# Patient Record
Sex: Male | Born: 2000 | Hispanic: Yes | Marital: Single
Health system: Southern US, Community
[De-identification: ages and names within clinical notes are randomized; demographics above are authoritative.]

---

## 2019-03-08 ENCOUNTER — Inpatient Hospital Stay (HOSPITAL_COMMUNITY): Payer: Self-pay | Admitting: Certified Registered Nurse Anesthetist

## 2019-03-08 ENCOUNTER — Emergency Department (HOSPITAL_COMMUNITY): Payer: Self-pay

## 2019-03-08 ENCOUNTER — Inpatient Hospital Stay (HOSPITAL_COMMUNITY)
Admission: EM | Admit: 2019-03-08 | Discharge: 2019-03-12 | DRG: 957 | Disposition: A | Discharge status: Home / Self Care | Payer: Self-pay | Attending: Surgery | Admitting: Surgery

## 2019-03-08 ENCOUNTER — Inpatient Hospital Stay (HOSPITAL_COMMUNITY): Payer: Self-pay

## 2019-03-08 ENCOUNTER — Encounter (HOSPITAL_COMMUNITY): Payer: Self-pay | Admitting: Pharmacy Technician

## 2019-03-08 ENCOUNTER — Encounter (HOSPITAL_COMMUNITY): Admission: EM | Disposition: A | Discharge status: Home / Self Care | Payer: Self-pay | Source: Home / Self Care

## 2019-03-08 DIAGNOSIS — S272XXA Traumatic hemopneumothorax, initial encounter: Secondary | ICD-10-CM | POA: Diagnosis present

## 2019-03-08 DIAGNOSIS — J9690 Respiratory failure, unspecified, unspecified whether with hypoxia or hypercapnia: Secondary | ICD-10-CM | POA: Diagnosis present

## 2019-03-08 DIAGNOSIS — S36039S Unspecified laceration of spleen, sequela: Secondary | ICD-10-CM

## 2019-03-08 DIAGNOSIS — S329XXA Fracture of unspecified parts of lumbosacral spine and pelvis, initial encounter for closed fracture: Secondary | ICD-10-CM

## 2019-03-08 DIAGNOSIS — W1789XA Other fall from one level to another, initial encounter: Secondary | ICD-10-CM | POA: Diagnosis present

## 2019-03-08 DIAGNOSIS — S22068A Other fracture of T7-T8 thoracic vertebra, initial encounter for closed fracture: Secondary | ICD-10-CM | POA: Diagnosis present

## 2019-03-08 DIAGNOSIS — S065X9A Traumatic subdural hemorrhage with loss of consciousness of unspecified duration, initial encounter: Secondary | ICD-10-CM | POA: Diagnosis present

## 2019-03-08 DIAGNOSIS — S2232XA Fracture of one rib, left side, initial encounter for closed fracture: Secondary | ICD-10-CM

## 2019-03-08 DIAGNOSIS — S27321A Contusion of lung, unilateral, initial encounter: Secondary | ICD-10-CM | POA: Diagnosis present

## 2019-03-08 DIAGNOSIS — S2242XA Multiple fractures of ribs, left side, initial encounter for closed fracture: Secondary | ICD-10-CM | POA: Diagnosis present

## 2019-03-08 DIAGNOSIS — S36031A Moderate laceration of spleen, initial encounter: Principal | ICD-10-CM | POA: Diagnosis present

## 2019-03-08 DIAGNOSIS — Z20828 Contact with and (suspected) exposure to other viral communicable diseases: Secondary | ICD-10-CM | POA: Diagnosis present

## 2019-03-08 DIAGNOSIS — Y939 Activity, unspecified: Secondary | ICD-10-CM

## 2019-03-08 DIAGNOSIS — S22058A Other fracture of T5-T6 vertebra, initial encounter for closed fracture: Secondary | ICD-10-CM | POA: Diagnosis present

## 2019-03-08 DIAGNOSIS — S36039A Unspecified laceration of spleen, initial encounter: Secondary | ICD-10-CM | POA: Diagnosis present

## 2019-03-08 DIAGNOSIS — S22078A Other fracture of T9-T10 vertebra, initial encounter for closed fracture: Secondary | ICD-10-CM | POA: Diagnosis present

## 2019-03-08 DIAGNOSIS — Y9289 Other specified places as the place of occurrence of the external cause: Secondary | ICD-10-CM

## 2019-03-08 DIAGNOSIS — I609 Nontraumatic subarachnoid hemorrhage, unspecified: Secondary | ICD-10-CM

## 2019-03-08 DIAGNOSIS — Y999 Unspecified external cause status: Secondary | ICD-10-CM

## 2019-03-08 DIAGNOSIS — R269 Unspecified abnormalities of gait and mobility: Secondary | ICD-10-CM | POA: Diagnosis present

## 2019-03-08 DIAGNOSIS — S32038A Other fracture of third lumbar vertebra, initial encounter for closed fracture: Secondary | ICD-10-CM | POA: Diagnosis present

## 2019-03-08 DIAGNOSIS — S32302A Unspecified fracture of left ilium, initial encounter for closed fracture: Secondary | ICD-10-CM | POA: Diagnosis present

## 2019-03-08 HISTORY — PX: IR EMBO ART  VEN HEMORR LYMPH EXTRAV  INC GUIDE ROADMAPPING: IMG5450

## 2019-03-08 HISTORY — PX: RADIOLOGY WITH ANESTHESIA: SHX6223

## 2019-03-08 HISTORY — PX: IR ANGIOGRAM VISCERAL SELECTIVE: IMG657

## 2019-03-08 HISTORY — PX: IR US GUIDE VASC ACCESS RIGHT: IMG2390

## 2019-03-08 HISTORY — PX: IR US GUIDE VASC ACCESS LEFT: IMG2389

## 2019-03-08 HISTORY — PX: IR ANGIOGRAM SELECTIVE EACH ADDITIONAL VESSEL: IMG667

## 2019-03-08 HISTORY — PX: IR FLUORO GUIDE CV LINE LEFT: IMG2282

## 2019-03-08 LAB — CBC
HCT: 44.2 % (ref 39.0–52.0)
HCT: 39.8 % (ref 39.0–52.0)
HCT: 33.6 % — ABNORMAL LOW (ref 39.0–52.0)
Hemoglobin: 15.5 g/dL (ref 13.0–17.0)
Hemoglobin: 13.7 g/dL (ref 13.0–17.0)
Hemoglobin: 11.9 g/dL — ABNORMAL LOW (ref 13.0–17.0)
MCH: 31.8 pg (ref 26.0–34.0)
MCH: 30.2 pg (ref 26.0–34.0)
MCH: 30.4 pg (ref 26.0–34.0)
MCHC: 35.1 g/dL (ref 30.0–36.0)
MCHC: 34.4 g/dL (ref 30.0–36.0)
MCHC: 35.4 g/dL (ref 30.0–36.0)
MCV: 90.8 fL (ref 80.0–100.0)
MCV: 87.7 fL (ref 80.0–100.0)
MCV: 85.9 fL (ref 80.0–100.0)
Platelets: 341 10*3/uL (ref 150–400)
Platelets: 157 10*3/uL (ref 150–400)
Platelets: 143 10*3/uL — ABNORMAL LOW (ref 150–400)
RBC: 4.87 MIL/uL (ref 4.22–5.81)
RBC: 4.54 MIL/uL (ref 4.22–5.81)
RBC: 3.91 MIL/uL — ABNORMAL LOW (ref 4.22–5.81)
RDW: 11.9 % (ref 11.5–15.5)
RDW: 14.3 % (ref 11.5–15.5)
RDW: 14.6 % (ref 11.5–15.5)
WBC: 20.8 10*3/uL — ABNORMAL HIGH (ref 4.0–10.5)
WBC: 12.4 10*3/uL — ABNORMAL HIGH (ref 4.0–10.5)
WBC: 6.9 10*3/uL (ref 4.0–10.5)
nRBC: 0 % (ref 0.0–0.2)
nRBC: 0 % (ref 0.0–0.2)
nRBC: 0 % (ref 0.0–0.2)

## 2019-03-08 LAB — ETHANOL: Alcohol, Ethyl (B): <10 mg/dL

## 2019-03-08 LAB — CDS SEROLOGY

## 2019-03-08 LAB — POCT I-STAT EG7
Acid-Base Excess: 1 mmol/L (ref 0.0–2.0)
Acid-base deficit: 9 mmol/L — ABNORMAL HIGH (ref 0.0–2.0)
Bicarbonate: 28.7 mmol/L — ABNORMAL HIGH (ref 20.0–28.0)
Bicarbonate: 16.4 mmol/L — ABNORMAL LOW (ref 20.0–28.0)
Calcium, Ion: 1.21 mmol/L (ref 1.15–1.40)
Calcium, Ion: 0.81 mmol/L — CL (ref 1.15–1.40)
HCT: 46 % (ref 39.0–52.0)
HCT: 24 % — ABNORMAL LOW (ref 39.0–52.0)
Hemoglobin: 15.6 g/dL (ref 13.0–17.0)
Hemoglobin: 8.2 g/dL — ABNORMAL LOW (ref 13.0–17.0)
O2 Saturation: 56 %
O2 Saturation: 74 %
Potassium: 3.1 mmol/L — ABNORMAL LOW (ref 3.5–5.1)
Potassium: 3.7 mmol/L (ref 3.5–5.1)
Sodium: 142 mmol/L (ref 135–145)
Sodium: 147 mmol/L — ABNORMAL HIGH (ref 135–145)
TCO2: 30 mmol/L (ref 22–32)
TCO2: 17 mmol/L — ABNORMAL LOW (ref 22–32)
pCO2, Ven: 59.2 mmHg (ref 44.0–60.0)
pCO2, Ven: 33.9 mmHg — ABNORMAL LOW (ref 44.0–60.0)
pH, Ven: 7.294 (ref 7.250–7.430)
pH, Ven: 7.291 (ref 7.250–7.430)
pO2, Ven: 33 mmHg (ref 32.0–45.0)
pO2, Ven: 44 mmHg (ref 32.0–45.0)

## 2019-03-08 LAB — URINALYSIS, ROUTINE W REFLEX MICROSCOPIC
Bilirubin Urine: NEGATIVE
Glucose, UA: 50 mg/dL — AB
Ketones, ur: NEGATIVE mg/dL
Leukocytes,Ua: NEGATIVE
Nitrite: NEGATIVE
Protein, ur: 100 mg/dL — AB
Specific Gravity, Urine: 1.043 — ABNORMAL HIGH (ref 1.005–1.030)
pH: 8 (ref 5.0–8.0)

## 2019-03-08 LAB — POC SARS CORONAVIRUS 2 AG -  ED: SARS Coronavirus 2 Ag: NEGATIVE

## 2019-03-08 LAB — I-STAT CHEM 8, ED
BUN: 11 mg/dL (ref 8–23)
Calcium, Ion: 1.21 mmol/L (ref 1.15–1.40)
Chloride: 102 mmol/L (ref 98–111)
Creatinine, Ser: 1 mg/dL (ref 0.61–1.24)
Glucose, Bld: 163 mg/dL — ABNORMAL HIGH (ref 70–99)
HCT: 46 % (ref 39.0–52.0)
Hemoglobin: 15.6 g/dL (ref 13.0–17.0)
Potassium: 3.2 mmol/L — ABNORMAL LOW (ref 3.5–5.1)
Sodium: 142 mmol/L (ref 135–145)
TCO2: 29 mmol/L (ref 22–32)

## 2019-03-08 LAB — POCT I-STAT 7, (LYTES, BLD GAS, ICA,H+H)
Acid-base deficit: 3 mmol/L — ABNORMAL HIGH (ref 0.0–2.0)
Bicarbonate: 21.2 mmol/L (ref 20.0–28.0)
Calcium, Ion: 1.13 mmol/L — ABNORMAL LOW (ref 1.15–1.40)
HCT: 37 % — ABNORMAL LOW (ref 39.0–52.0)
Hemoglobin: 12.6 g/dL — ABNORMAL LOW (ref 13.0–17.0)
O2 Saturation: 100 %
Patient temperature: 98.6
Potassium: 4.4 mmol/L (ref 3.5–5.1)
Sodium: 138 mmol/L (ref 135–145)
TCO2: 22 mmol/L (ref 22–32)
pCO2 arterial: 33 mmHg (ref 32.0–48.0)
pH, Arterial: 7.416 (ref 7.350–7.450)
pO2, Arterial: 165 mmHg — ABNORMAL HIGH (ref 83.0–108.0)

## 2019-03-08 LAB — PROTIME-INR
INR: 1.2 (ref 0.8–1.2)
Prothrombin Time: 15.5 seconds — ABNORMAL HIGH (ref 11.4–15.2)

## 2019-03-08 LAB — GLUCOSE, CAPILLARY: Glucose-Capillary: 90 mg/dL (ref 70–99)

## 2019-03-08 LAB — SAMPLE TO BLOOD BANK

## 2019-03-08 LAB — LACTIC ACID, PLASMA: Lactic Acid, Venous: 3.5 mmol/L (ref 0.5–1.9)

## 2019-03-08 LAB — PREPARE RBC (CROSSMATCH)

## 2019-03-08 LAB — MRSA PCR SCREENING: MRSA by PCR: NEGATIVE

## 2019-03-08 SURGERY — IR WITH ANESTHESIA
Anesthesia: General

## 2019-03-08 MED ORDER — PROPOFOL 500 MG/50ML IV EMUL
INTRAVENOUS | Status: DC | PRN
  Administered 2019-03-08: 50 ug/kg/min via INTRAVENOUS

## 2019-03-08 MED ORDER — PROPOFOL 1000 MG/100ML IV EMUL
0.0000 ug/kg/min | INTRAVENOUS | Status: DC
  Administered 2019-03-08: 50 ug/kg/min via INTRAVENOUS
  Administered 2019-03-08 – 2019-03-09 (×2): 45 ug/kg/min via INTRAVENOUS
  Filled 2019-03-08 (×3): qty 100

## 2019-03-08 MED ORDER — FENTANYL CITRATE (PF) 100 MCG/2ML IJ SOLN
INTRAMUSCULAR | Status: AC | PRN
  Administered 2019-03-08 (×2): 50 ug via INTRAVENOUS

## 2019-03-08 MED ORDER — PANTOPRAZOLE SODIUM 40 MG IV SOLR
40.0000 mg | Freq: Every day | INTRAVENOUS | Status: DC
  Administered 2019-03-09: 40 mg via INTRAVENOUS
  Filled 2019-03-08: qty 40

## 2019-03-08 MED ORDER — SODIUM BICARBONATE 8.4 % IV SOLN
INTRAVENOUS | Status: AC
  Filled 2019-03-08: qty 50

## 2019-03-08 MED ORDER — ROCURONIUM BROMIDE 10 MG/ML (PF) SYRINGE
PREFILLED_SYRINGE | INTRAVENOUS | Status: DC | PRN
  Administered 2019-03-08: 30 mg via INTRAVENOUS
  Administered 2019-03-08: 50 mg via INTRAVENOUS

## 2019-03-08 MED ORDER — ALBUMIN HUMAN 5 % IV SOLN
INTRAVENOUS | Status: DC | PRN
  Administered 2019-03-08 (×2): via INTRAVENOUS

## 2019-03-08 MED ORDER — PANTOPRAZOLE SODIUM 40 MG PO TBEC: 40.0000 mg | DELAYED_RELEASE_TABLET | Freq: Every day | ORAL | Status: DC

## 2019-03-08 MED ORDER — CEFAZOLIN SODIUM-DEXTROSE 2-4 GM/100ML-% IV SOLN
INTRAVENOUS | Status: AC
  Filled 2019-03-08: qty 100

## 2019-03-08 MED ORDER — IOHEXOL 300 MG/ML  SOLN
100.0000 mL | Freq: Once | INTRAMUSCULAR | Status: AC | PRN
  Administered 2019-03-08: 100 mL via INTRAVENOUS

## 2019-03-08 MED ORDER — PHENYLEPHRINE HCL-NACL 10-0.9 MG/250ML-% IV SOLN
INTRAVENOUS | Status: DC | PRN
  Administered 2019-03-08: 25 ug/min via INTRAVENOUS

## 2019-03-08 MED ORDER — FENTANYL 2500MCG IN NS 250ML (10MCG/ML) PREMIX INFUSION
50.0000 ug/h | INTRAVENOUS | Status: DC
  Administered 2019-03-08: 100 ug/h via INTRAVENOUS
  Filled 2019-03-08: qty 250

## 2019-03-08 MED ORDER — SUCCINYLCHOLINE CHLORIDE 20 MG/ML IJ SOLN
INTRAMUSCULAR | Status: AC | PRN
  Administered 2019-03-08: 100 mg via INTRAVENOUS

## 2019-03-08 MED ORDER — FENTANYL CITRATE (PF) 100 MCG/2ML IJ SOLN: 50.0000 ug | Freq: Once | INTRAMUSCULAR | Status: DC

## 2019-03-08 MED ORDER — FENTANYL CITRATE (PF) 100 MCG/2ML IJ SOLN
INTRAMUSCULAR | Status: AC
  Filled 2019-03-08: qty 2

## 2019-03-08 MED ORDER — LIDOCAINE HCL 1 % IJ SOLN
INTRAMUSCULAR | Status: AC
  Filled 2019-03-08: qty 20

## 2019-03-08 MED ORDER — DOCUSATE SODIUM 50 MG/5ML PO LIQD: 100.0000 mg | Freq: Two times a day (BID) | ORAL | Status: DC | PRN

## 2019-03-08 MED ORDER — ONDANSETRON 4 MG PO TBDP: 4.0000 mg | ORAL_TABLET | Freq: Four times a day (QID) | ORAL | Status: DC | PRN

## 2019-03-08 MED ORDER — CEFAZOLIN SODIUM-DEXTROSE 2-3 GM-%(50ML) IV SOLR
INTRAVENOUS | Status: DC | PRN
  Administered 2019-03-08: 2 g via INTRAVENOUS

## 2019-03-08 MED ORDER — ROCURONIUM BROMIDE 50 MG/5ML IV SOLN
INTRAVENOUS | Status: AC | PRN
  Administered 2019-03-08: 100 mg via INTRAVENOUS

## 2019-03-08 MED ORDER — IOHEXOL 300 MG/ML  SOLN
100.0000 mL | Freq: Once | INTRAMUSCULAR | Status: AC | PRN
  Administered 2019-03-08: 40 mL via INTRA_ARTERIAL

## 2019-03-08 MED ORDER — LACTATED RINGERS IV SOLN
INTRAVENOUS | Status: DC | PRN
  Administered 2019-03-08 (×2): via INTRAVENOUS

## 2019-03-08 MED ORDER — ORAL CARE MOUTH RINSE
15.0000 mL | OROMUCOSAL | Status: DC
  Administered 2019-03-08 – 2019-03-09 (×9): 15 mL via OROMUCOSAL

## 2019-03-08 MED ORDER — IOHEXOL 300 MG/ML  SOLN
150.0000 mL | Freq: Once | INTRAMUSCULAR | Status: AC | PRN
  Administered 2019-03-08: 48 mL via INTRA_ARTERIAL

## 2019-03-08 MED ORDER — PHENYLEPHRINE 40 MCG/ML (10ML) SYRINGE FOR IV PUSH (FOR BLOOD PRESSURE SUPPORT)
PREFILLED_SYRINGE | INTRAVENOUS | Status: DC | PRN
  Administered 2019-03-08: 120 ug via INTRAVENOUS
  Administered 2019-03-08 (×2): 80 ug via INTRAVENOUS

## 2019-03-08 MED ORDER — POTASSIUM CHLORIDE IN NACL 20-0.9 MEQ/L-% IV SOLN
INTRAVENOUS | Status: DC
  Administered 2019-03-08 – 2019-03-09 (×2): via INTRAVENOUS
  Filled 2019-03-08 (×2): qty 1000

## 2019-03-08 MED ORDER — SODIUM CHLORIDE 0.9 % IV SOLN
INTRAVENOUS | Status: DC | PRN
  Administered 2019-03-08: 14:00:00 via INTRAVENOUS

## 2019-03-08 MED ORDER — PROPOFOL 1000 MG/100ML IV EMUL
INTRAVENOUS | Status: AC
  Administered 2019-03-08: 18:00:00
  Filled 2019-03-08: qty 100

## 2019-03-08 MED ORDER — SODIUM CHLORIDE 0.9 % IV SOLN
INTRAVENOUS | Status: AC | PRN
  Administered 2019-03-08: 1000 mL via INTRAVENOUS

## 2019-03-08 MED ORDER — SODIUM BICARBONATE 4.2 % IV SOLN
INTRAVENOUS | Status: DC | PRN
  Administered 2019-03-08 (×2): 50 meq via INTRAVENOUS

## 2019-03-08 MED ORDER — CHLORHEXIDINE GLUCONATE CLOTH 2 % EX PADS
6.0000 | MEDICATED_PAD | Freq: Every day | CUTANEOUS | Status: DC
  Administered 2019-03-08 – 2019-03-12 (×3): 6 via TOPICAL

## 2019-03-08 MED ORDER — ONDANSETRON HCL 4 MG/2ML IJ SOLN: 4.0000 mg | Freq: Four times a day (QID) | INTRAMUSCULAR | Status: DC | PRN

## 2019-03-08 MED ORDER — SODIUM CHLORIDE 0.9% IV SOLUTION: Freq: Once | INTRAVENOUS | Status: DC

## 2019-03-08 MED ORDER — CHLORHEXIDINE GLUCONATE 0.12% ORAL RINSE (MEDLINE KIT)
15.0000 mL | Freq: Two times a day (BID) | OROMUCOSAL | Status: DC
  Administered 2019-03-08 – 2019-03-09 (×3): 15 mL via OROMUCOSAL

## 2019-03-08 MED ORDER — ETOMIDATE 2 MG/ML IV SOLN
INTRAVENOUS | Status: AC | PRN
  Administered 2019-03-08: 20 mg via INTRAVENOUS

## 2019-03-08 MED ORDER — FENTANYL BOLUS VIA INFUSION
50.0000 ug | INTRAVENOUS | Status: DC | PRN
  Filled 2019-03-08: qty 50

## 2019-03-08 NOTE — ED Triage Notes (Signed)
Pt bib ems after approx 20 foot unwitnessed fall. Bystanders found pt unresponsive. Ems reports pt moaning and combative with periods of apnea. No obvious trauma noted.

## 2019-03-08 NOTE — ED Notes (Signed)
Dr Arnoldo Morale with Neurosurgery at the bedside

## 2019-03-08 NOTE — Anesthesia Procedure Notes (Signed)
Date/Time: 03/08/2019 2:15 PM Performed by: Colin Benton, CRNA Pre-anesthesia Checklist: Patient identified, Emergency Drugs available, Suction available and Patient being monitored Patient Re-evaluated:Patient Re-evaluated prior to induction Oxygen Delivery Method: Circle system utilized Induction Type: Inhalational induction with existing ETT Placement Confirmation: positive ETCO2 and breath sounds checked- equal and bilateral Dental Injury: Teeth and Oropharynx as per pre-operative assessment

## 2019-03-08 NOTE — ED Notes (Signed)
Father contacted. Spanish speaking only. [phone].

## 2019-03-08 NOTE — Progress Notes (Signed)
Pt transported to IR with ED RN, RT and IR RN on vent. Pt switched over to CRNA's vent upon arrival and RT placed pt's servo vent in room 4N 26. No apparent complications during transport.

## 2019-03-08 NOTE — Procedures (Signed)
FAST  Pre-procedure diagnosis:fall Post-procedure diagnosis:no pericardial effusion, equivocal pelvic view Procedure: FAST Surgeon: Georganna Skeans, MD Procedure in detail: The patient's abdomen was imaged in 4 regions with the ultrasound. First, the right upper quadrant was imaged. No free fluid was seen between the right kidney and the liver in Morison's pouch. Next, the epigastrium was imaged. No significant pericardial effusion was seen. Next, the left upper quadrant was imaged. No free fluid was seen between the left kidney and the spleen. Finally, the bladder was imaged. ? free fluid was seen next to the bladder in the pelvis. Impression: ? Free fluid next to bladder  Georganna Skeans, MD, MPH, FACS Trauma: [phone] General Surgery: [phone]

## 2019-03-08 NOTE — Anesthesia Preprocedure Evaluation (Addendum)
Anesthesia Evaluation   Patient unresponsive    Reviewed: Unable to perform ROS - Chart review onlyPreop documentation limited or incomplete due to emergent nature of procedure.  Airway Mallampati: Intubated       Dental   Pulmonary neg recent URI,    breath sounds clear to auscultation       Cardiovascular  Rate:Tachycardia     Neuro/Psych    GI/Hepatic   Endo/Other    Renal/GU      Musculoskeletal   Abdominal   Peds  Hematology  (+) Blood dyscrasia, anemia , Acute blood loss anemia   Anesthesia Other Findings Left iliac wing fx splenic lac TBI with subdural  multiple rib fxs with pulm contusions multiple TVP fxs   Reproductive/Obstetrics                             Anesthesia Physical Anesthesia Plan  ASA: III and emergent  Anesthesia Plan: General   Post-op Pain Management:    Induction: Inhalational  PONV Risk Score and Plan: 2 and Treatment may vary due to age or medical condition  Airway Management Planned: Oral ETT  Additional Equipment: Arterial line  Intra-op Plan:   Post-operative Plan: Post-operative intubation/ventilation  Informed Consent:     History available from chart only and Only emergency history available  Plan Discussed with: CRNA  Anesthesia Plan Comments:         Anesthesia Quick Evaluation

## 2019-03-08 NOTE — Procedures (Signed)
Interventional Radiology Procedure:   Indications: Trauma and splenic laceration.    Procedure: Visceral angiogram and splenic artery embolization  Placement of left femoral central line  Findings: Variant anatomy.  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  Main splenic artery was embolized with 6 mm coils.  Minor residual flow to spleen through collaterals after embolization.  No active bleeding from spleen.  Exoseal used for right groin hemostasis.   Dual lumen central line placed in left CFV.   Complications: None     EBL: less than 20 ml  Plan: Keep right leg straight for 3 hours.    Allen Allen R. Anselm Pancoast, MD  Pager: [phone]

## 2019-03-08 NOTE — Progress Notes (Signed)
Received patient from the ED, Gwenith Daily RN.  Patient transported from ED to IR with this RN, Visual merchandiser and respiratory.  Patient on a vent.  Anesthesia in IR suite upon arrival.

## 2019-03-08 NOTE — Transfer of Care (Signed)
Immediate Anesthesia Transfer of Care Note  Patient: Allen Allen  Procedure(s) Performed: IR FOR EMBOLIZATION (N/A )  Patient Location: ICU  Anesthesia Type:General  Level of Consciousness: sedated and Patient remains intubated per anesthesia plan  Airway & Oxygen Therapy: Patient remains intubated per anesthesia plan and Patient placed on Ventilator (see vital sign flow sheet for setting)  Post-op Assessment: Report given to RN and Post -op Vital signs reviewed and stable  Post vital signs: Reviewed and stable  Last Vitals:  Vitals Value Taken Time  BP    Temp 36.1 C 03/08/19 1634  Pulse 75 03/08/19 1636  Resp 21 03/08/19 1636  SpO2 100 % 03/08/19 1636  Vitals shown include unvalidated device data.  Last Pain:  Vitals:   03/08/19 1634  TempSrc: Axillary         Complications: No apparent anesthesia complications

## 2019-03-08 NOTE — H&P (Signed)
Allen Allen 04/19/1877  [id number].    Requesting MD: Dr. Sedonia Small Chief Complaint: Fall  Reason for Consult: Level 1  Primary Survey: airway intact, breath sounds intact bilaterally, pulses intact peripherally  GCS: E3V4M5  HPI: Allen Allen is a 18 y.o. male with no reported past medical hx who was brought in as a level 1 to [facility] after he was found down after a suspected fall. Per EMS, patient was at a construction site at the [place]. He was suspected to have a fall from 25+ft through a skylight. + LOC. This was unwitnessed. EMS reports no blood or broken glass at the scene. He awoke on route and was combative. Upon arrival to trauma bay, patient was able to state name and state her hurt all over. He was combative. Had tenderness to chest wall. No obvious deformities to extremities. He was intubated in trauma bay. CXR and Pelvis xray unremarkable. He was taken to CT scanner for further eval.   ROS: Review of Systems  Unable to perform ROS: Intubated    No family history on file.  No past medical history on file.  The histories are not reviewed yet. Please review them in the "History" navigator section and refresh this [place].  Social History:  has no history on file for tobacco, alcohol, and drug.  Allergies: Not on File  (Not in a hospital admission)    Physical Exam: Blood pressure 134/90, pulse (!) 102, temperature (!) 97 F (36.1 C), temperature source Temporal, resp. rate 19, height 5\' 10"  (1.778 m), weight 74.8 kg, SpO2 100 %. Physical Exam  Constitutional: No distress. He is intubated.  HENT:  Head: Normocephalic and atraumatic. Head is without raccoon's eyes, without Battle's sign, without abrasion and without contusion.  Right Ear: External ear normal.  Left Ear: External ear normal.  Nose: Nose normal.  Mouth/Throat: Uvula is midline, oropharynx is clear and moist and mucous membranes are normal. Normal dentition.  OG tube in place  Eyes: Pupils are  equal, round, and reactive to light. Conjunctivae are normal.  51mm and sluggish  Neck:  In c-collar  Cardiovascular: Regular rhythm and normal heart sounds. Tachycardia present.  No murmur heard. Pulses:      Radial pulses are 2+ on the right side and 2+ on the left side.       Dorsalis pedis pulses are 2+ on the right side and 2+ on the left side.       Posterior tibial pulses are 2+ on the right side and 2+ on the left side.  Pulmonary/Chest: Effort normal and breath sounds normal. He is intubated. He exhibits tenderness (b/l). He exhibits no crepitus and no deformity.  Abdominal: Soft. Normal appearance and bowel sounds are normal. There is no abdominal tenderness. There is no rigidity and no guarding.  Genitourinary:    Genitourinary Comments: Normal rectal tone without obvious blood   Musculoskeletal:     Comments: Passive rom of all extremities without obvious deformity. No step offs of t or l spine.   Neurological:  Sedated on vent  Skin: Skin is warm, dry and intact. No abrasion noted.  Nursing note and vitals reviewed.    No results found for this or any previous visit (from the past 48 hour(s)). No results found.  Anti-infectives (From admission, onward)   None       Assessment/Plan Fall from 25+ft G3 splenic lac with parasplenic hematoma and active extrav - To IR Left iliac wing fracture  with surrounding hematoma - Ortho consulted Left SDH - Consult to NS Left Pulm Contusion/small PTX/HPTX - No CT warranted at this time.  Left Rib Fx 6, 7, 8 - Multimodal pain control  T7, T8, T9 and L3 TP fxs - Multimodal pain control  VDRF  Foley - To be placed  FEN - NPO, OGT, IVF VTE - SCDs ID - None  Plan: 1U PRBC and 1U FFP. To IR for splenic lac with active extrav. ICU after.   Jacinto Halim, [facility] Surgery 03/08/2019, 11:41 AM Please see Amion for pager number during day hours 7:00am-4:30pm

## 2019-03-08 NOTE — ED Notes (Signed)
Aspen collar applied

## 2019-03-08 NOTE — Consult Note (Signed)
Chief Complaint: Patient was seen in consultation today for mesenteric arteriogram with possible splenic embolization at the request of Dr Elwyn LadeB Thompson   Supervising Physician: Richarda OverlieHenn, Adam  [facility]  History of Present Illness: Allen Allen is a [age] y.o. male   Unwitnessed fall-- brought to ED by EMS Multiple injuries Intubated  Trauma note 11/19: Fall from 25+ft G3 splenic lac with parasplenic hematoma and active extrav - To IR Left iliac wing fracture with surrounding hematoma - Ortho consulted Left SDH - Consult to NS Left Pulm Contusion/small PTX/HPTX - No CT warranted at this time.  Left Rib Fx 6, 7, 8 - Multimodal pain control  T7, T8, T9 and L3 TP fxs   Request for mesenteric arteriogram with possible splenic embolization per Dr Elwyn LadeB Thompson    Allergies: Patient has no known allergies.  Medications: Prior to Admission medications   Not on File     No family history on file.  Social History   Socioeconomic History   Marital status: Not on file    Spouse name: Not on file   Number of children: Not on file   Years of education: Not on file   Highest education level: Not on file  Occupational History   Not on file  Social Needs   Financial resource strain: Not on file   Food insecurity    Worry: Not on file    Inability: Not on file   Transportation needs    Medical: Not on file    Non-medical: Not on file  Tobacco Use   Smoking status: Not on file  Substance and Sexual Activity   Alcohol use: Not on file   Drug use: Not on file   Sexual activity: Not on file  Lifestyle   Physical activity    Days per week: Not on file    Minutes per session: Not on file   Stress: Not on file  Relationships   Social connections    Talks on phone: Not on file    Gets together: Not on file    Attends religious service: Not on file    Active member of club or organization: Not on file    Attends meetings of clubs or  organizations: Not on file    Relationship status: Not on file  Other Topics Concern   Not on file  Social History Narrative   Not on file    Review of Systems: A 12 point ROS discussed and pertinent positives are indicated in the HPI above.  All other systems are negative.   Vital Signs: BP 127/83    Pulse (!) 115    Temp (!) 97 F (36.1 C) (Temporal)    Resp 16    Ht 5\' 10"  (1.778 m)    Wt 165 lb (74.8 kg)    SpO2 100%    BMI 23.68 kg/m   Physical Exam Vitals signs reviewed.  Constitutional:      Comments: intubated  Musculoskeletal:     Comments: No movement  Skin:    General: Skin is warm.  Neurological:     Comments: No response     Imaging: Ct Head Wo Contrast  Result Date: 03/08/2019 CLINICAL DATA:  Fall.  Found down. EXAM: CT HEAD WITHOUT CONTRAST CT CERVICAL SPINE WITHOUT CONTRAST TECHNIQUE: Multidetector CT imaging of the head and cervical spine was performed following the standard protocol without intravenous contrast. Multiplanar CT image reconstructions of the cervical spine were also  generated. COMPARISON:  None. FINDINGS: CT HEAD FINDINGS Brain: Small left cerebral convexity acute subdural hematoma measuring up to 4-5 mm in maximal thickness. Minimal mass effect on the adjacent sulci with 3 mm left-to-right midline shift. No herniation. No evidence of acute infarction, hydrocephalus, or mass lesion. Vascular: No hyperdense vessel or unexpected calcification. Skull: Normal. Negative for fracture or focal lesion. Sinuses/Orbits: No acute finding. Other: None. CT CERVICAL SPINE FINDINGS Alignment: Straightening of the normal cervical lordosis. No traumatic malalignment. Skull base and vertebrae: No acute fracture. No primary bone lesion or focal pathologic process. Soft tissues and spinal canal: No prevertebral fluid or swelling. No visible canal hematoma. Disc levels:  Normal. Upper chest: Please see separate CT chest report from same day. Other: None. IMPRESSION: 1.  Small left cerebral convexity acute subdural hematoma measuring up to 4-5 mm in maximal thickness. 3 mm left-to-right midline shift. 2.  No acute cervical spine fracture. Critical Value/emergent results were discussed in person at the time of interpretation on 03/08/2019 at 12:10 pm with provider Violeta Gelinas, who verbally acknowledged these results. Electronically Signed   By: Obie Dredge M.D.   On: 03/08/2019 12:23   Ct Cervical Spine Wo Contrast  Result Date: 03/08/2019 CLINICAL DATA:  Fall.  Found down. EXAM: CT HEAD WITHOUT CONTRAST CT CERVICAL SPINE WITHOUT CONTRAST TECHNIQUE: Multidetector CT imaging of the head and cervical spine was performed following the standard protocol without intravenous contrast. Multiplanar CT image reconstructions of the cervical spine were also generated. COMPARISON:  None. FINDINGS: CT HEAD FINDINGS Brain: Small left cerebral convexity acute subdural hematoma measuring up to 4-5 mm in maximal thickness. Minimal mass effect on the adjacent sulci with 3 mm left-to-right midline shift. No herniation. No evidence of acute infarction, hydrocephalus, or mass lesion. Vascular: No hyperdense vessel or unexpected calcification. Skull: Normal. Negative for fracture or focal lesion. Sinuses/Orbits: No acute finding. Other: None. CT CERVICAL SPINE FINDINGS Alignment: Straightening of the normal cervical lordosis. No traumatic malalignment. Skull base and vertebrae: No acute fracture. No primary bone lesion or focal pathologic process. Soft tissues and spinal canal: No prevertebral fluid or swelling. No visible canal hematoma. Disc levels:  Normal. Upper chest: Please see separate CT chest report from same day. Other: None. IMPRESSION: 1. Small left cerebral convexity acute subdural hematoma measuring up to 4-5 mm in maximal thickness. 3 mm left-to-right midline shift. 2.  No acute cervical spine fracture. Critical Value/emergent results were discussed in person at the time of  interpretation on 03/08/2019 at 12:10 pm with provider Violeta Gelinas, who verbally acknowledged these results. Electronically Signed   By: Obie Dredge M.D.   On: 03/08/2019 12:23   Dg Pelvis Portable  Result Date: 03/08/2019 CLINICAL DATA:  Level 1 trauma EXAM: PORTABLE PELVIS 1-2 VIEWS COMPARISON:  None. FINDINGS: There is no evidence of pelvic fracture or diastasis. No pelvic bone lesions are seen. IMPRESSION: Negative. Electronically Signed   By: Marnee Spring M.D.   On: 03/08/2019 11:47   Dg Chest Port 1 View  Result Date: 03/08/2019 CLINICAL DATA:  Male patient with trauma. EXAM: PORTABLE CHEST 1 VIEW COMPARISON:  None. FINDINGS: Endotracheal tube with tip approximately 12 mm above the carina. Recommend retraction by 2-3 cm for optimal positioning. Enteric tube with side-port in the distal esophagus and tip in the proximal stomach. Recommend advancing the tube by additional 10 cm. Minimal interstitial densities in the left lung may represent mild contusion. An atypical infection is not excluded. The right lung is clear. There  is no pleural effusion or pneumothorax. The cardiac silhouette is within normal limits. No acute osseous pathology. IMPRESSION: 1. Endotracheal tube with tip above the carina. Recommend retraction by 2-3 cm for optimal positioning. 2. Enteric tube with side-port in the distal esophagus and tip in the proximal stomach. Recommend further advancing by additional 10 cm. 3. Minimal left lung densities may represent contusion. Electronically Signed   By: Anner Crete M.D.   On: 03/08/2019 11:49    Labs:  CBC: Recent Labs    03/08/19 1135 03/08/19 1137 03/08/19 1222 03/08/19 1237  WBC 20.8*  --   --   --   HGB 15.5 15.6 15.6 12.6*  HCT 44.2 46.0 46.0 37.0*  PLT 341  --   --   --     COAGS: Recent Labs    03/08/19 1135  INR 1.2    BMP: Recent Labs    03/08/19 1135 03/08/19 1137 03/08/19 1222 03/08/19 1237  NA 140 142 142 138  K 3.2* 3.2* 3.1*  4.4  CL 104 102  --   --   CO2 25  --   --   --   GLUCOSE 173* 163*  --   --   BUN 8 11  --   --   CALCIUM 9.1  --   --   --   CREATININE 1.02 1.00  --   --   GFRNONAA 44*  --   --   --   GFRAA 51*  --   --   --     LIVER FUNCTION TESTS: Recent Labs    03/08/19 1135  BILITOT 0.9  AST 190*  ALT 115*  ALKPHOS 147*  PROT 7.0  ALBUMIN 4.3    TUMOR MARKERS: No results for input(s): AFPTM, CEA, CA199, CHROMGRNA in the last 8760 hours.  Assessment and Plan:  Grade 3 trauma Splenic hematoma and active extravasation Scheduled for emergent mesenteric arteriogram with possible embolization Dr Grandville Silos signed emergent consent Consent in IR  Thank you for this interesting consult.  I greatly enjoyed meeting Allen Allen and look forward to participating in their care.  A copy of this report was sent to the requesting provider on this date.  Electronically Signed: Lavonia Drafts, PA-C 03/08/2019, 12:50 PM   I spent a total of 20 Minutes    in face to face in clinical consultation, greater than 50% of which was counseling/coordinating care for mesenteric arteriogram with embolization
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
## 2019-03-08 NOTE — ED Provider Notes (Signed)
[facility] Emergency Department Provider Note MRN:  [id number]  Arrival date & time: 03/08/19     Chief Complaint   Altered mental status History of Present Illness   Allen Allen is a 18 y.o. year-old male with unknown past medical history presenting to the ED with chief complaint of altered mental status.  Report of head trauma after fall from 25 feet.  Report of dilated pupils and agitation.  I was unable to obtain an accurate HPI, PMH, or ROS due to the patient's altered mental status.  Level 5 caveat.  Review of Systems  Positive for head trauma, altered mental status  Patient's Health History   History reviewed. No pertinent past medical history.    History reviewed. No pertinent family history.  Social History   Socioeconomic History   Marital status: Not on file    Spouse name: Not on file   Number of children: Not on file   Years of education: Not on file   Highest education level: Not on file  Occupational History   Not on file  Social Needs   Financial resource strain: Not on file   Food insecurity    Worry: Not on file    Inability: Not on file   Transportation needs    Medical: Not on file    Non-medical: Not on file  Tobacco Use   Smoking status: Not on file  Substance and Sexual Activity   Alcohol use: Not on file   Drug use: Not on file   Sexual activity: Not on file  Lifestyle   Physical activity    Days per week: Not on file    Minutes per session: Not on file   Stress: Not on file  Relationships   Social connections    Talks on phone: Not on file    Gets together: Not on file    Attends religious service: Not on file    Active member of club or organization: Not on file    Attends meetings of clubs or organizations: Not on file    Relationship status: Not on file   Intimate partner violence    Fear of current or ex partner: Not on file    Emotionally abused: Not on file    Physically abused:  Not on file    Forced sexual activity: Not on file  Other Topics Concern   Not on file  Social History Narrative   Not on file     Physical Exam  Vital Signs and Nursing Notes reviewed Vitals:   03/08/19 1350 03/08/19 1355  BP: (!) 106/58 (!) 97/57  Pulse: 96 99  Resp: 16 16  Temp: 98.7 F (37.1 C) 98.8 F (37.1 C)  SpO2: 100% 99%    CONSTITUTIONAL: Agitated and combative NEURO: Confused, not following commands, moving all extremities EYES:  eyes equal and reactive ENT/NECK:  no LAD, no JVD CARDIO:  tachycardic rate, well-perfused, normal S1 and S2 PULM:  CTAB no wheezing or rhonchi GI/GU:  normal bowel sounds, non-distended, non-tender MSK/SPINE:  No gross deformities, no edema SKIN:  no rash, atraumatic PSYCH: Unable to assess  Diagnostic and Interventional Summary    EKG Interpretation  Date/Time:  Thursday March 08 2019 12:17:52 EST Ventricular Rate:  107 PR Interval:    QRS Duration: 75 QT Interval:  305 QTC Calculation: 407 R Axis:   79 Text Interpretation: Sinus tachycardia Borderline T abnormalities, diffuse leads Confirmed by Kennis Carina [phone]) on 03/08/2019 12:24:59 PM  Labs Reviewed  COMPREHENSIVE METABOLIC PANEL - Abnormal; Notable for the following components:      Result Value   Potassium 3.2 (*)    Glucose, Bld 173 (*)    AST 190 (*)    ALT 115 (*)    Alkaline Phosphatase 147 (*)    GFR calc non Af Amer 44 (*)    GFR calc Af Amer 51 (*)    All other components within normal limits  CBC - Abnormal; Notable for the following components:   WBC 20.8 (*)    All other components within normal limits  URINALYSIS, ROUTINE W REFLEX MICROSCOPIC - Abnormal; Notable for the following components:   APPearance HAZY (*)    Specific Gravity, Urine 1.043 (*)    Glucose, UA 50 (*)    Hgb urine dipstick SMALL (*)    Protein, ur 100 (*)    Bacteria, UA RARE (*)    All other components within normal limits  LACTIC ACID, PLASMA - Abnormal;  Notable for the following components:   Lactic Acid, Venous 3.5 (*)    All other components within normal limits  PROTIME-INR - Abnormal; Notable for the following components:   Prothrombin Time 15.5 (*)    All other components within normal limits  I-STAT CHEM 8, ED - Abnormal; Notable for the following components:   Potassium 3.2 (*)    Glucose, Bld 163 (*)    All other components within normal limits  POCT I-STAT EG7 - Abnormal; Notable for the following components:   Bicarbonate 28.7 (*)    Potassium 3.1 (*)    All other components within normal limits  POCT I-STAT 7, (LYTES, BLD GAS, ICA,H+H) - Abnormal; Notable for the following components:   pO2, Arterial 165.0 (*)    Acid-base deficit 3.0 (*)    Calcium, Ion 1.13 (*)    HCT 37.0 (*)    Hemoglobin 12.6 (*)    All other components within normal limits  ETHANOL  CDS SEROLOGY  POC SARS CORONAVIRUS 2 AG -  ED  SAMPLE TO BLOOD BANK  PREPARE RBC (CROSSMATCH)  PREPARE FRESH FROZEN PLASMA  TYPE AND SCREEN  ABO/RH    CT Head Wo Contrast  Final Result    CT Cervical Spine Wo Contrast  Final Result    CT Chest W Contrast  Final Result    CT ABDOMEN PELVIS W CONTRAST  Final Result    DG Pelvis Portable  Final Result    DG Chest Port 1 View  Final Result    IR Transcath/Emboliz    (Results Pending)  CT HEAD WO CONTRAST    (Results Pending)    Medications  propofol (DIPRIVAN) 1000 MG/100ML infusion (has no administration in time range)  fentaNYL (SUBLIMAZE) 100 MCG/2ML injection (has no administration in time range)  0.9 %  sodium chloride infusion (Manually program via Guardrails IV Fluids) (has no administration in time range)  0.9 %  sodium chloride infusion (Manually program via Guardrails IV Fluids) (has no administration in time range)  lidocaine (XYLOCAINE) 1 % (with pres) injection (has no administration in time range)  iohexol (OMNIPAQUE) 300 MG/ML solution 150 mL (has no administration in time range)    iohexol (OMNIPAQUE) 300 MG/ML solution 100 mL (has no administration in time range)  ceFAZolin (ANCEF) 2-4 GM/100ML-% IVPB (has no administration in time range)  sodium bicarbonate 1 mEq/mL injection (has no administration in time range)  sodium bicarbonate 1 mEq/mL injection (has no administration in time range)  etomidate (AMIDATE) injection (20 mg Intravenous Given 03/08/19 1125)  succinylcholine (ANECTINE) injection (100 mg Intravenous Given 03/08/19 1126)  0.9 %  sodium chloride infusion (1,000 mLs Intravenous New Bag/Given 03/08/19 1127)  iohexol (OMNIPAQUE) 300 MG/ML solution 100 mL (100 mLs Intravenous Contrast Given 03/08/19 1150)  rocuronium (ZEMURON) injection (100 mg Intravenous Given 03/08/19 1137)  fentaNYL (SUBLIMAZE) injection (50 mcg Intravenous Given 03/08/19 1201)     Procedures  /  Critical Care .Critical Care Performed by: Maudie Flakes, MD Authorized by: Maudie Flakes, MD   Critical care provider statement:    Critical care time (minutes):  35   Critical care was necessary to treat or prevent imminent or life-threatening deterioration of the following conditions:  Trauma   Critical care was time spent personally by me on the following activities:  Discussions with consultants, evaluation of patient's response to treatment, examination of patient, ordering and performing treatments and interventions, ordering and review of laboratory studies, ordering and review of radiographic studies, pulse oximetry, re-evaluation of patient's condition, obtaining history from patient or surrogate and review of old charts Procedure Name: Intubation Date/Time: 03/08/2019 11:48 AM Performed by: Maudie Flakes, MD Pre-anesthesia Checklist: Patient identified, Patient being monitored, Emergency Drugs available, Timeout performed and Suction available Oxygen Delivery Method: Non-rebreather mask Preoxygenation: Pre-oxygenation with 100% oxygen Induction Type: Rapid  sequence Ventilation: Mask ventilation without difficulty Laryngoscope Size: Glidescope and 4 Grade View: Grade II Tube size: 7.5 mm Number of attempts: 1 Airway Equipment and Method: Rigid stylet Placement Confirmation: ETT inserted through vocal cords under direct vision,  CO2 detector and Breath sounds checked- equal and bilateral Secured at: 22 cm Comments: Uncomplicated RSI with 20 mg etomidate and 100 mg succinylcholine       ED Course and Medical Decision Making  I have reviewed the triage vital signs and the nursing notes.  Pertinent labs & imaging results that were available during my care of the patient were reviewed by me and considered in my medical decision making (see below for details).     Concern for possible intracranial bleeding given the mechanism, head trauma, altered mental status.  Primary survey largely reassuring, talking, protecting airway, bilateral breath sounds, strong peripheral pulses.  Intubated as described above for airway protection in the setting of combativeness and needing CT imaging to exclude emergent process.  Hemodynamically stable, awaiting CT results.  Imaging reveals small hemopneumothorax, subarachnoid hemorrhage with midline shift, grade 4 splenic lack, to be brought emergently to IR suite for embolization.  Admitted to trauma service.  Barth Kirks. Sedonia Small, [place] [email or web address]  Final Clinical Impressions(s) / ED Diagnoses     ICD-10-CM   1. SAH (subarachnoid hemorrhage) (HCC)  I60.9   2. Spleen laceration  S36.039A IR Transcath/Emboliz    IR Transcath/Emboliz  3. Laceration of spleen, initial encounter  S36.039A IR Transcath/Emboliz    ED Discharge Orders    None       Discharge Instructions Discussed with and Provided to Patient:   Discharge Instructions   None       Maudie Flakes, MD 03/08/19 1542

## 2019-03-08 NOTE — Anesthesia Postprocedure Evaluation (Signed)
Anesthesia Post Note  Patient: Allen Allen  Procedure(s) Performed: IR FOR EMBOLIZATION (N/A )     Patient location during evaluation: SICU Anesthesia Type: General Level of consciousness: sedated Pain management: pain level controlled Vital Signs Assessment: post-procedure vital signs reviewed and stable Respiratory status: patient remains intubated per anesthesia plan Cardiovascular status: stable Postop Assessment: no apparent nausea or vomiting Anesthetic complications: no    Last Vitals:  Vitals:   03/08/19 1637 03/08/19 1638  BP:  (!) 141/114  Pulse: 77 74  Resp: (!) 27 (!) 21  Temp:  (!) 36.1 C  SpO2: 100% 100%    Last Pain:  Vitals:   03/08/19 1638  TempSrc: Axillary                 Magie Ciampa P Dallin Mccorkel

## 2019-03-08 NOTE — Consult Note (Signed)
Reason for Consult: Acute left subdural hematoma Referring Physician: Dr. Janee Morn  Allen Allen is an 18 y.o. male.  HPI: The patient is a young Hispanic male who by report was found unconscious after a fall.  He was brought to [facility], ER and intubated.  He was worked up further with a head CT which demonstrated a small left acute subdural hematoma and a CT of the abdomen pelvis which demonstrated a splenic injury.  The plan is for IR embolization of a splenic injury.  A neurosurgical consultation was requested by the trauma service.  Presently the patient is sedated and intubated there are no family members available.  A history cannot be obtained.  No past medical history on file.    No family history on file.  Social History:  has no history on file for tobacco, alcohol, and drug.  Allergies: No Known Allergies  Medications:  I have reviewed the patient's current medications. Prior to Admission: (Not in a hospital admission)  Scheduled: . sodium chloride   Intravenous Once  . sodium chloride   Intravenous Once  . fentaNYL       Continuous: . propofol     PRN: Anti-infectives (From admission, onward)   None       Results for orders placed or performed during the hospital encounter of 03/08/19 (from the past 48 hour(s))  Sample to Blood Bank     Status: None   Collection Time: 03/08/19 11:24 AM  Result Value Ref Range   Blood Bank Specimen SAMPLE AVAILABLE FOR TESTING    Sample Expiration      03/11/2019,2359 Performed at [facility], [address] [place], [place] [address]   Lactic acid, plasma     Status: Abnormal   Collection Time: 03/08/19 11:30 AM  Result Value Ref Range   Lactic Acid, Venous 3.5 (HH) 0.5 - 1.9 mmol/L    Comment: CRITICAL RESULT CALLED TO, READ BACK BY AND VERIFIED WITH: K.COBB,RN [id number] 03/08/2019 CLARK,S Performed at [facility], [address], [place], [place] [address]   Type and screen Ordered by PROVIDER  DEFAULT     Status: None (Preliminary result)   Collection Time: 03/08/19 11:32 AM  Result Value Ref Range   ABO/RH(D) O POS    Antibody Screen NEG    Sample Expiration 03/11/2019,2359    Unit Number [id number]    Blood Component Type RED CELLS,LR    Unit division 00    Status of Unit ISSUED    Transfusion Status OK TO TRANSFUSE    Crossmatch Result COMPATIBLE   ABO/Rh     Status: None (Preliminary result)   Collection Time: 03/08/19 11:32 AM  Result Value Ref Range   ABO/RH(D)      O POS Performed at [facility], [address], [place], [place] [address]   Ethanol     Status: None   Collection Time: 03/08/19 11:33 AM  Result Value Ref Range   Alcohol, Ethyl (B) <10 <10 mg/dL    Comment: (NOTE) Lowest detectable limit for serum alcohol is 10 mg/dL. For medical purposes only. Performed at [facility], [address] [place], [place] [address]   Comprehensive metabolic panel     Status: Abnormal   Collection Time: 03/08/19 11:35 AM  Result Value Ref Range   Sodium 140 135 - 145 mmol/L   Potassium 3.2 (L) 3.5 - 5.1 mmol/L   Chloride 104 98 - 111 mmol/L  CO2 25 22 - 32 mmol/L   Glucose, Bld 173 (H) 70 - 99 mg/dL   BUN 8 8 - 23 mg/dL   Creatinine, Ser 8.651.02 0.61 - 1.24 mg/dL   Calcium 9.1 8.9 - 78.410.3 mg/dL   Total Protein 7.0 6.5 - 8.1 g/dL   Albumin 4.3 3.5 - 5.0 g/dL   AST 696190 (H) 15 - 41 U/L   ALT 115 (H) 0 - 44 U/L   Alkaline Phosphatase 147 (H) 38 - 126 U/L   Total Bilirubin 0.9 0.3 - 1.2 mg/dL   GFR calc non Af Amer 44 (L) >60 mL/min   GFR calc Af Amer 51 (L) >60 mL/min   Anion gap 11 5 - 15    Comment: Performed at [facility], [address] [place], [place] [address]  CBC     Status: Abnormal   Collection Time: 03/08/19 11:35 AM  Result Value Ref Range   WBC 20.8 (H) 4.0 - 10.5 K/uL   RBC 4.87 4.22 - 5.81 MIL/uL   Hemoglobin 15.5 13.0 - 17.0 g/dL   HCT 41.344.2 24.439.0 - 01.052.0 %   MCV 90.8 80.0 - 100.0 fL   MCH 31.8 26.0 - 34.0 pg    MCHC 35.1 30.0 - 36.0 g/dL   RDW 27.211.9 53.611.5 - 64.415.5 %   Platelets 341 150 - 400 K/uL   nRBC 0.0 0.0 - 0.2 %    Comment: Performed at [facility], [address] [place], [place] [address]  Protime-INR     Status: Abnormal   Collection Time: 03/08/19 11:35 AM  Result Value Ref Range   Prothrombin Time 15.5 (H) 11.4 - 15.2 seconds   INR 1.2 0.8 - 1.2    Comment: (NOTE) INR goal varies based on device and disease states. Performed at [facility], [address] [place], [place] [address]   I-stat chem 8, ED     Status: Abnormal   Collection Time: 03/08/19 11:37 AM  Result Value Ref Range   Sodium 142 135 - 145 mmol/L   Potassium 3.2 (L) 3.5 - 5.1 mmol/L   Chloride 102 98 - 111 mmol/L   BUN 11 8 - 23 mg/dL   Creatinine, Ser 3.871.00 0.61 - 1.24 mg/dL   Glucose, Bld 564163 (H) 70 - 99 mg/dL   Calcium, Ion 3.321.21 9.511.15 - 1.40 mmol/L   TCO2 29 22 - 32 mmol/L   Hemoglobin 15.6 13.0 - 17.0 g/dL   HCT 88.446.0 16.639.0 - 06.352.0 %  POC SARS Coronavirus 2 Ag-ED -     Status: None   Collection Time: 03/08/19 11:57 AM  Result Value Ref Range   SARS Coronavirus 2 Ag NEGATIVE NEGATIVE    Comment: (NOTE) SARS-CoV-2 antigen NOT DETECTED.  Negative results are presumptive.  Negative results do not preclude SARS-CoV-2 infection and should not be used as the sole basis for treatment or other patient management decisions, including infection  control decisions, particularly in the presence of clinical signs and  symptoms consistent with COVID-19, or in those who have been in contact with the virus.  Negative results must be combined with clinical observations, patient history, and epidemiological information. The expected result is Negative. Fact Sheet for Patients: https://sanders-williams.net/https://www.fda.gov/media/139754/download Fact Sheet for Healthcare Providers: https://martinez.com/https://www.fda.gov/media/139753/download This test is not yet approved or cleared by the [place] FDA and  has been authorized for detection and/or  diagnosis of SARS-CoV-2 by FDA under an Emergency Use Authorization (EUA).  This EUA will  remain in effect (meaning this test can be used) for the duration of  the COVID-19 de claration under Section 564(b)(1) of the Act, 21 U.S.C. section 360bbb-3(b)(1), unless the authorization is terminated or revoked sooner.   Prepare fresh frozen plasma     Status: None (Preliminary result)   Collection Time: 03/08/19 12:21 PM  Result Value Ref Range   Unit Number [id number]    Blood Component Type LIQ PLASMA    Unit division 00    Status of Unit ISSUED    Unit tag comment VERBAL ORDERS PER DR BERO    Transfusion Status      OK TO TRANSFUSE Performed at [facility], [address] [place], [place] [address]   POCT I-Stat EG7     Status: Abnormal   Collection Time: 03/08/19 12:22 PM  Result Value Ref Range   pH, Ven 7.294 7.250 - 7.430   pCO2, Ven 59.2 44.0 - 60.0 mmHg   pO2, Ven 33.0 32.0 - 45.0 mmHg   Bicarbonate 28.7 (H) 20.0 - 28.0 mmol/L   TCO2 30 22 - 32 mmol/L   O2 Saturation 56.0 %   Acid-Base Excess 1.0 0.0 - 2.0 mmol/L   Sodium 142 135 - 145 mmol/L   Potassium 3.1 (L) 3.5 - 5.1 mmol/L   Calcium, Ion 1.21 1.15 - 1.40 mmol/L   HCT 46.0 39.0 - 52.0 %   Hemoglobin 15.6 13.0 - 17.0 g/dL   Patient temperature HIDE    Sample type VENOUS    Comment MD NOTIFIED, SUGGEST RECOLLECT   Urinalysis, Routine w reflex microscopic     Status: Abnormal   Collection Time: 03/08/19 12:25 PM  Result Value Ref Range   Color, Urine YELLOW YELLOW   APPearance HAZY (A) CLEAR   Specific Gravity, Urine 1.043 (H) 1.005 - 1.030   pH 8.0 5.0 - 8.0   Glucose, UA 50 (A) NEGATIVE mg/dL   Hgb urine dipstick SMALL (A) NEGATIVE   Bilirubin Urine NEGATIVE NEGATIVE   Ketones, ur NEGATIVE NEGATIVE mg/dL   Protein, ur 782 (A) NEGATIVE mg/dL   Nitrite NEGATIVE NEGATIVE   Leukocytes,Ua NEGATIVE NEGATIVE   RBC / HPF 21-50 0 - 5 RBC/hpf   WBC, UA 6-10 0 - 5 WBC/hpf   Bacteria, UA RARE (A) NONE SEEN    Squamous Epithelial / LPF 0-5 0 - 5    Comment: Performed at [facility], [address] [place], [place] [address]  I-STAT 7, (LYTES, BLD GAS, ICA, H+H)     Status: Abnormal   Collection Time: 03/08/19 12:37 PM  Result Value Ref Range   pH, Arterial 7.416 7.350 - 7.450   pCO2 arterial 33.0 32.0 - 48.0 mmHg   pO2, Arterial 165.0 (H) 83.0 - 108.0 mmHg   Bicarbonate 21.2 20.0 - 28.0 mmol/L   TCO2 22 22 - 32 mmol/L   O2 Saturation 100.0 %   Acid-base deficit 3.0 (H) 0.0 - 2.0 mmol/L   Sodium 138 135 - 145 mmol/L   Potassium 4.4 3.5 - 5.1 mmol/L   Calcium, Ion 1.13 (L) 1.15 - 1.40 mmol/L   HCT 37.0 (L) 39.0 - 52.0 %   Hemoglobin 12.6 (L) 13.0 - 17.0 g/dL   Patient temperature 30.8 F    Sample type ARTERIAL     Ct Head Wo Contrast  Result Date: 03/08/2019 CLINICAL DATA:  Fall.  Found down. EXAM: CT HEAD WITHOUT CONTRAST CT CERVICAL SPINE WITHOUT CONTRAST TECHNIQUE: Multidetector CT imaging of the head  and cervical spine was performed following the standard protocol without intravenous contrast. Multiplanar CT image reconstructions of the cervical spine were also generated. COMPARISON:  None. FINDINGS: CT HEAD FINDINGS Brain: Small left cerebral convexity acute subdural hematoma measuring up to 4-5 mm in maximal thickness. Minimal mass effect on the adjacent sulci with 3 mm left-to-right midline shift. No herniation. No evidence of acute infarction, hydrocephalus, or mass lesion. Vascular: No hyperdense vessel or unexpected calcification. Skull: Normal. Negative for fracture or focal lesion. Sinuses/Orbits: No acute finding. Other: None. CT CERVICAL SPINE FINDINGS Alignment: Straightening of the normal cervical lordosis. No traumatic malalignment. Skull base and vertebrae: No acute fracture. No primary bone lesion or focal pathologic process. Soft tissues and spinal canal: No prevertebral fluid or swelling. No visible canal hematoma. Disc levels:  Normal. Upper chest: Please see separate  CT chest report from same day. Other: None. IMPRESSION: 1. Small left cerebral convexity acute subdural hematoma measuring up to 4-5 mm in maximal thickness. 3 mm left-to-right midline shift. 2.  No acute cervical spine fracture. Critical Value/emergent results were discussed in person at the time of interpretation on 03/08/2019 at 12:10 pm with provider Georganna Skeans, who verbally acknowledged these results. Electronically Signed   By: Titus Dubin M.D.   On: 03/08/2019 12:23   Ct Chest W Contrast  Result Date: 03/08/2019 CLINICAL DATA:  Fall.  Found down. EXAM: CT CHEST, ABDOMEN, AND PELVIS WITH CONTRAST TECHNIQUE: Multidetector CT imaging of the chest, abdomen and pelvis was performed following the standard protocol during bolus administration of intravenous contrast. CONTRAST:  162mL OMNIPAQUE IOHEXOL 300 MG/ML  SOLN COMPARISON:  None. FINDINGS: CT CHEST FINDINGS Cardiovascular: No significant vascular findings. Normal heart size. No pericardial effusion. No thoracic aortic aneurysm or dissection. Normal central pulmonary arteries. Mediastinum/Nodes: Residual thymus in the anterior mediastinum. No pneumomediastinum. No enlarged mediastinal, hilar, or axillary lymph nodes. Calcified right hilar lymph nodes. Thyroid gland and esophagus demonstrate no significant findings. Enteric tube within the esophagus. Lungs/Pleura: Endotracheal tube tip 1.1 cm above the carina. Small left hemopneumothorax. Debris and hemorrhage within the left lower lobe bronchi. Partial collapse of the left lower lobe with extensive contusion and small traumatic pneumatocele consistent with laceration. There are a few scattered contusions in the posterior left upper lobe. The right lung is clear. Musculoskeletal: Acute nondisplaced fractures of the left posterolateral sixth, seventh, and eighth ribs. Additional acute nondisplaced fracture of the left posterior eighth rib. Acute nondisplaced fractures of the left T6-T9 transverse  processes. No vertebral body fracture. CT ABDOMEN PELVIS FINDINGS Hepatobiliary: No focal hepatic injury. Trace perihepatic hematoma at the inferior tip of the liver. The gallbladder is unremarkable. No biliary dilatation. Pancreas: Unremarkable. No pancreatic ductal dilatation or surrounding inflammatory changes. Spleen: Extensive splenic lacerations with 8 mm focus of intraparenchymal vascular contrast (series 3, image 51; series 5, image 54) that slightly increases in size on delayed imaging, consistent with active bleeding. Small perisplenic hematoma. Adrenals/Urinary Tract: No adrenal hemorrhage or renal injury identified. Bladder is unremarkable. Stomach/Bowel: Enteric tube in the stomach. The stomach is within normal limits. No bowel wall thickening, distention, or surrounding inflammatory changes. Normal appendix. Vascular/Lymphatic: No significant vascular findings are present. No enlarged abdominal or pelvic lymph nodes. Prominent mesenteric lymph nodes are likely reactive. Reproductive: Prostate is unremarkable. Other: Small amount of hemorrhage in the pelvis. No pneumoperitoneum. Musculoskeletal: Acute mildly displaced fracture of the left iliac wing involving the physis with surrounding hematoma. Acute nondisplaced fracture of the left L3 transverse process. IMPRESSION: Chest:  1. Small left hemopneumothorax. 2. Partial collapse of left lower lobe with extensive contusion and small traumatic pneumatocele consistent with laceration. Debris and hemorrhage within the left lower lobe bronchi. 3. Few scattered contusions in the posterior left upper lobe. 4. Acute nondisplaced fractures of the left sixth through eighth ribs and left T6-T9 transverse processes. For Abdomen and pelvis: 1. Grade IV splenic injury with 8 mm focus of intraparenchymal vascular contrast that slightly increases in size on delayed imaging, consistent with active bleeding. Small perisplenic hematoma. 2. Trace perihepatic hematoma at the  inferior tip of the liver without definite underlying hepatic injury. 3. Acute mildly displaced fracture of the left iliac wing involving the physis. 4. Acute nondisplaced fracture of the left L3 transverse process. Critical Value/emergent results were discussed in person at the time of interpretation on 03/08/2019 at 12:10 pm with provider Violeta Gelinas, who verbally acknowledged these results. Electronically Signed   By: Obie Dredge M.D.   On: 03/08/2019 12:51   Ct Cervical Spine Wo Contrast  Result Date: 03/08/2019 CLINICAL DATA:  Fall.  Found down. EXAM: CT HEAD WITHOUT CONTRAST CT CERVICAL SPINE WITHOUT CONTRAST TECHNIQUE: Multidetector CT imaging of the head and cervical spine was performed following the standard protocol without intravenous contrast. Multiplanar CT image reconstructions of the cervical spine were also generated. COMPARISON:  None. FINDINGS: CT HEAD FINDINGS Brain: Small left cerebral convexity acute subdural hematoma measuring up to 4-5 mm in maximal thickness. Minimal mass effect on the adjacent sulci with 3 mm left-to-right midline shift. No herniation. No evidence of acute infarction, hydrocephalus, or mass lesion. Vascular: No hyperdense vessel or unexpected calcification. Skull: Normal. Negative for fracture or focal lesion. Sinuses/Orbits: No acute finding. Other: None. CT CERVICAL SPINE FINDINGS Alignment: Straightening of the normal cervical lordosis. No traumatic malalignment. Skull base and vertebrae: No acute fracture. No primary bone lesion or focal pathologic process. Soft tissues and spinal canal: No prevertebral fluid or swelling. No visible canal hematoma. Disc levels:  Normal. Upper chest: Please see separate CT chest report from same day. Other: None. IMPRESSION: 1. Small left cerebral convexity acute subdural hematoma measuring up to 4-5 mm in maximal thickness. 3 mm left-to-right midline shift. 2.  No acute cervical spine fracture. Critical Value/emergent results  were discussed in person at the time of interpretation on 03/08/2019 at 12:10 pm with provider Violeta Gelinas, who verbally acknowledged these results. Electronically Signed   By: Obie Dredge M.D.   On: 03/08/2019 12:23   Ct Abdomen Pelvis W Contrast  Result Date: 03/08/2019 CLINICAL DATA:  Fall.  Found down. EXAM: CT CHEST, ABDOMEN, AND PELVIS WITH CONTRAST TECHNIQUE: Multidetector CT imaging of the chest, abdomen and pelvis was performed following the standard protocol during bolus administration of intravenous contrast. CONTRAST:  OMNIPAQUE IOHEXOL 300 MG/ML  SOLN COMPARISON:  None. FINDINGS: CT CHEST FINDINGS Cardiovascular: No significant vascular findings. Normal heart size. No pericardial effusion. No thoracic aortic aneurysm or dissection. Normal central pulmonary arteries. Mediastinum/Nodes: Residual thymus in the anterior mediastinum. No pneumomediastinum. No enlarged mediastinal, hilar, or axillary lymph nodes. Calcified right hilar lymph nodes. Thyroid gland and esophagus demonstrate no significant findings. Enteric tube within the esophagus. Lungs/Pleura: Endotracheal tube tip 1.1 cm above the carina. Small left hemopneumothorax. Debris and hemorrhage within the left lower lobe bronchi. Partial collapse of the left lower lobe with extensive contusion and small traumatic pneumatocele consistent with laceration. There are a few scattered contusions in the posterior left upper lobe. The right lung is clear. Musculoskeletal:  Acute nondisplaced fractures of the left posterolateral sixth, seventh, and eighth ribs. Additional acute nondisplaced fracture of the left posterior eighth rib. Acute nondisplaced fractures of the left T6-T9 transverse processes. No vertebral body fracture. CT ABDOMEN PELVIS FINDINGS Hepatobiliary: No focal hepatic injury. Trace perihepatic hematoma at the inferior tip of the liver. The gallbladder is unremarkable. No biliary dilatation. Pancreas: Unremarkable. No  pancreatic ductal dilatation or surrounding inflammatory changes. Spleen: Extensive splenic lacerations with 8 mm focus of intraparenchymal vascular contrast (series 3, image 51; series 5, image 54) that slightly increases in size on delayed imaging, consistent with active bleeding. Small perisplenic hematoma. Adrenals/Urinary Tract: No adrenal hemorrhage or renal injury identified. Bladder is unremarkable. Stomach/Bowel: Enteric tube in the stomach. The stomach is within normal limits. No bowel wall thickening, distention, or surrounding inflammatory changes. Normal appendix. Vascular/Lymphatic: No significant vascular findings are present. No enlarged abdominal or pelvic lymph nodes. Prominent mesenteric lymph nodes are likely reactive. Reproductive: Prostate is unremarkable. Other: Small amount of hemorrhage in the pelvis. No pneumoperitoneum. Musculoskeletal: Acute mildly displaced fracture of the left iliac wing involving the physis with surrounding hematoma. Acute nondisplaced fracture of the left L3 transverse process. IMPRESSION: Chest: 1. Small left hemopneumothorax. 2. Partial collapse of left lower lobe with extensive contusion and small traumatic pneumatocele consistent with laceration. Debris and hemorrhage within the left lower lobe bronchi. 3. Few scattered contusions in the posterior left upper lobe. 4. Acute nondisplaced fractures of the left sixth through eighth ribs and left T6-T9 transverse processes. For Abdomen and pelvis: 1. Grade IV splenic injury with 8 mm focus of intraparenchymal vascular contrast that slightly increases in size on delayed imaging, consistent with active bleeding. Small perisplenic hematoma. 2. Trace perihepatic hematoma at the inferior tip of the liver without definite underlying hepatic injury. 3. Acute mildly displaced fracture of the left iliac wing involving the physis. 4. Acute nondisplaced fracture of the left L3 transverse process. Critical Value/emergent results  were discussed in person at the time of interpretation on 03/08/2019 at 12:10 pm with provider Violeta Gelinas, who verbally acknowledged these results. Electronically Signed   By: Obie Dredge M.D.   On: 03/08/2019 12:51   Dg Pelvis Portable  Result Date: 03/08/2019 CLINICAL DATA:  Level 1 trauma EXAM: PORTABLE PELVIS 1-2 VIEWS COMPARISON:  None. FINDINGS: There is no evidence of pelvic fracture or diastasis. No pelvic bone lesions are seen. IMPRESSION: Negative. Electronically Signed   By: Marnee Spring M.D.   On: 03/08/2019 11:47   Dg Chest Port 1 View  Result Date: 03/08/2019 CLINICAL DATA:  Male patient with trauma. EXAM: PORTABLE CHEST 1 VIEW COMPARISON:  None. FINDINGS: Endotracheal tube with tip approximately 12 mm above the carina. Recommend retraction by 2-3 cm for optimal positioning. Enteric tube with side-port in the distal esophagus and tip in the proximal stomach. Recommend advancing the tube by additional 10 cm. Minimal interstitial densities in the left lung may represent mild contusion. An atypical infection is not excluded. The right lung is clear. There is no pleural effusion or pneumothorax. The cardiac silhouette is within normal limits. No acute osseous pathology. IMPRESSION: 1. Endotracheal tube with tip above the carina. Recommend retraction by 2-3 cm for optimal positioning. 2. Enteric tube with side-port in the distal esophagus and tip in the proximal stomach. Recommend further advancing by additional 10 cm. 3. Minimal left lung densities may represent contusion. Electronically Signed   By: Elgie Collard M.D.   On: 03/08/2019 11:49    ROS: Unobtainable  Blood pressure 120/76, pulse (!) 108, temperature 98.6 F (37 C), resp. rate 16, height 5\' 10"  (1.778 m), weight 74.8 kg, SpO2 100 %. Estimated body mass index is 23.68 kg/m as calculated from the following:   Height as of this encounter: 5\' 10"  (1.778 m).   Weight as of this encounter: 74.8 kg.  Physical  Exam  General: An intubated and sedated 31-ish-year-old Hispanic male in no apparent distress.  HEENT: The patient has a left parietal scalp swelling.  His pupils are equal round reactive light.  He has conjugate gaze.  I do not see any evidence of battle sign, raccoon's eyes, CSF otorrhea or rhinorrhea.  Neck: No obvious deformities.  He is in a hard collar  Thorax: Symmetric  Abdomen: Thin and soft  Extremities: Unremarkable  Neurologic exam: Glasgow Coma Scale 8, E2M5V1 intubated and sedated.  The patient does not follow commands.  He is very purposeful bilaterally and moves all 4 extremities.  He has corneal and cough reflexes.  I have reviewed the patient's head CT performed at [facility] today.  There is a small left acute subdural hematoma with mild mass effect.  I have also reviewed the patient's cervical CT performed at [facility] today.  It is unremarkable.  I reviewed his CT of the chest abdomen and pelvis only as it pertains to his spine.  I do not see any thoracic or lumbar fractures.    Assessment/Plan: Small acute left subdural hematoma: I do not think he needs an ICP monitor as he is quite purposeful bilaterally even while sedated with propofol.  We will continue to monitor his clinical exam and plan to repeat his CAT scan tomorrow, or sooner if he were to have a neurologic decline.  His cervical CT is unremarkable, but I would keep him in a cervical collar until we can clear him clinically.  [facility] 03/08/2019, 1:33 PM

## 2019-03-08 NOTE — Progress Notes (Signed)
Time out complete.  Patient under the care of anesthesia during procedure - Image Guided Splenic Arteriogram Mesenteric with possible Embolization.

## 2019-03-08 NOTE — ED Notes (Signed)
Ortho at the bedside.

## 2019-03-08 NOTE — ED Notes (Signed)
Propofol initiated at this time at 20

## 2019-03-09 ENCOUNTER — Inpatient Hospital Stay (HOSPITAL_COMMUNITY): Payer: Self-pay

## 2019-03-09 ENCOUNTER — Encounter (HOSPITAL_COMMUNITY): Payer: Self-pay

## 2019-03-09 LAB — BPAM FFP
Blood Product Expiration Date: 202011222359
Blood Product Expiration Date: 202011222359
Blood Product Expiration Date: 202011222359
Blood Product Expiration Date: 202011222359
Blood Product Expiration Date: 202011242359
Blood Product Expiration Date: 202011302359
ISSUE DATE / TIME: 202011191224
ISSUE DATE / TIME: 202011191442
ISSUE DATE / TIME: 202011191442
ISSUE DATE / TIME: 202011191442
ISSUE DATE / TIME: 202011191442
Unit Type and Rh: 6200
Unit Type and Rh: 6200
Unit Type and Rh: 6200
Unit Type and Rh: 6200
Unit Type and Rh: 6200
Unit Type and Rh: 6200

## 2019-03-09 LAB — BASIC METABOLIC PANEL
Anion gap: 8 (ref 5–15)
BUN: 8 mg/dL (ref 6–20)
CO2: 22 mmol/L (ref 22–32)
Calcium: 8.4 mg/dL — ABNORMAL LOW (ref 8.9–10.3)
Chloride: 112 mmol/L — ABNORMAL HIGH (ref 98–111)
Creatinine, Ser: 0.9 mg/dL (ref 0.61–1.24)
GFR calc Af Amer: 60 mL/min — ABNORMAL LOW (ref >60)
GFR calc non Af Amer: 52 mL/min — ABNORMAL LOW (ref >60)
Glucose, Bld: 89 mg/dL (ref 70–99)
Potassium: 3.4 mmol/L — ABNORMAL LOW (ref 3.5–5.1)
Sodium: 142 mmol/L (ref 135–145)

## 2019-03-09 LAB — PREPARE FRESH FROZEN PLASMA
Unit division: 0
Unit division: 0
Unit division: 0
Unit division: 0
Unit division: 0

## 2019-03-09 LAB — POCT I-STAT 7, (LYTES, BLD GAS, ICA,H+H)
Acid-base deficit: 2 mmol/L (ref 0.0–2.0)
Bicarbonate: 21 mmol/L (ref 20.0–28.0)
Calcium, Ion: 1.21 mmol/L (ref 1.15–1.40)
HCT: 31 % — ABNORMAL LOW (ref 39.0–52.0)
Hemoglobin: 10.5 g/dL — ABNORMAL LOW (ref 13.0–17.0)
O2 Saturation: 99 %
Patient temperature: 37.6
Potassium: 3.3 mmol/L — ABNORMAL LOW (ref 3.5–5.1)
Sodium: 144 mmol/L (ref 135–145)
TCO2: 22 mmol/L (ref 22–32)
pCO2 arterial: 31.3 mmHg — ABNORMAL LOW (ref 32.0–48.0)
pH, Arterial: 7.437 (ref 7.350–7.450)
pO2, Arterial: 124 mmHg — ABNORMAL HIGH (ref 83.0–108.0)

## 2019-03-09 LAB — POCT I-STAT, CHEM 8
BUN: 11 mg/dL (ref 6–20)
BUN: 9 mg/dL (ref 6–20)
Calcium, Ion: 1.21 mmol/L (ref 1.15–1.40)
Calcium, Ion: 1.16 mmol/L (ref 1.15–1.40)
Chloride: 102 mmol/L (ref 98–111)
Chloride: 105 mmol/L (ref 98–111)
Creatinine, Ser: 1 mg/dL (ref 0.61–1.24)
Creatinine, Ser: 0.7 mg/dL (ref 0.61–1.24)
Glucose, Bld: 163 mg/dL — ABNORMAL HIGH (ref 70–99)
Glucose, Bld: 99 mg/dL (ref 70–99)
HCT: 46 % (ref 39.0–52.0)
HCT: 28 % — ABNORMAL LOW (ref 39.0–52.0)
Hemoglobin: 15.6 g/dL (ref 13.0–17.0)
Hemoglobin: 9.5 g/dL — ABNORMAL LOW (ref 13.0–17.0)
Potassium: 3.2 mmol/L — ABNORMAL LOW (ref 3.5–5.1)
Potassium: 4.2 mmol/L (ref 3.5–5.1)
Sodium: 142 mmol/L (ref 135–145)
Sodium: 143 mmol/L (ref 135–145)
TCO2: 29 mmol/L (ref 22–32)
TCO2: 20 mmol/L — ABNORMAL LOW (ref 22–32)

## 2019-03-09 LAB — CBC
HCT: 31.6 % — ABNORMAL LOW (ref 39.0–52.0)
HCT: 33.9 % — ABNORMAL LOW (ref 39.0–52.0)
HCT: 35.5 % — ABNORMAL LOW (ref 39.0–52.0)
Hemoglobin: 11.1 g/dL — ABNORMAL LOW (ref 13.0–17.0)
Hemoglobin: 11.7 g/dL — ABNORMAL LOW (ref 13.0–17.0)
Hemoglobin: 12.3 g/dL — ABNORMAL LOW (ref 13.0–17.0)
MCH: 30.5 pg (ref 26.0–34.0)
MCH: 30 pg (ref 26.0–34.0)
MCH: 30.1 pg (ref 26.0–34.0)
MCHC: 35.1 g/dL (ref 30.0–36.0)
MCHC: 34.5 g/dL (ref 30.0–36.0)
MCHC: 34.6 g/dL (ref 30.0–36.0)
MCV: 86.8 fL (ref 80.0–100.0)
MCV: 86.9 fL (ref 80.0–100.0)
MCV: 87 fL (ref 80.0–100.0)
Platelets: 141 10*3/uL — ABNORMAL LOW (ref 150–400)
Platelets: 142 10*3/uL — ABNORMAL LOW (ref 150–400)
Platelets: 145 10*3/uL — ABNORMAL LOW (ref 150–400)
RBC: 3.64 MIL/uL — ABNORMAL LOW (ref 4.22–5.81)
RBC: 3.9 MIL/uL — ABNORMAL LOW (ref 4.22–5.81)
RBC: 4.08 MIL/uL — ABNORMAL LOW (ref 4.22–5.81)
RDW: 14.9 % (ref 11.5–15.5)
RDW: 14.7 % (ref 11.5–15.5)
RDW: 14.5 % (ref 11.5–15.5)
WBC: 6.5 10*3/uL (ref 4.0–10.5)
WBC: 9.8 10*3/uL (ref 4.0–10.5)
WBC: 10 10*3/uL (ref 4.0–10.5)
nRBC: 0 % (ref 0.0–0.2)
nRBC: 0 % (ref 0.0–0.2)
nRBC: 0 % (ref 0.0–0.2)

## 2019-03-09 LAB — COMPREHENSIVE METABOLIC PANEL
ALT: 115 U/L — ABNORMAL HIGH (ref 0–44)
AST: 190 U/L — ABNORMAL HIGH (ref 15–41)
Albumin: 4.3 g/dL (ref 3.5–5.0)
Alkaline Phosphatase: 147 U/L — ABNORMAL HIGH (ref 38–126)
Anion gap: 11 (ref 5–15)
BUN: 8 mg/dL (ref 6–20)
CO2: 25 mmol/L (ref 22–32)
Calcium: 9.1 mg/dL (ref 8.9–10.3)
Chloride: 104 mmol/L (ref 98–111)
Creatinine, Ser: 1.02 mg/dL (ref 0.61–1.24)
GFR calc Af Amer: 51 mL/min — ABNORMAL LOW (ref >60)
GFR calc non Af Amer: 44 mL/min — ABNORMAL LOW (ref >60)
Glucose, Bld: 173 mg/dL — ABNORMAL HIGH (ref 70–99)
Potassium: 3.2 mmol/L — ABNORMAL LOW (ref 3.5–5.1)
Sodium: 140 mmol/L (ref 135–145)
Total Bilirubin: 0.9 mg/dL (ref 0.3–1.2)
Total Protein: 7 g/dL (ref 6.5–8.1)

## 2019-03-09 LAB — POCT I-STAT EG7
Acid-base deficit: 18 mmol/L — ABNORMAL HIGH (ref 0.0–2.0)
Bicarbonate: 9.3 mmol/L — ABNORMAL LOW (ref 20.0–28.0)
Calcium, Ion: 0.93 mmol/L — ABNORMAL LOW (ref 1.15–1.40)
HCT: 19 % — ABNORMAL LOW (ref 39.0–52.0)
Hemoglobin: 6.5 g/dL — CL (ref 13.0–17.0)
O2 Saturation: 74 %
Potassium: 2.7 mmol/L — CL (ref 3.5–5.1)
Sodium: 113 mmol/L — CL (ref 135–145)
TCO2: 10 mmol/L — ABNORMAL LOW (ref 22–32)
pCO2, Ven: 26.3 mmHg — ABNORMAL LOW (ref 44.0–60.0)
pH, Ven: 7.158 — CL (ref 7.250–7.430)
pO2, Ven: 49 mmHg — ABNORMAL HIGH (ref 32.0–45.0)

## 2019-03-09 LAB — GLUCOSE, CAPILLARY
Glucose-Capillary: 122 mg/dL — ABNORMAL HIGH (ref 70–99)
Glucose-Capillary: 148 mg/dL — ABNORMAL HIGH (ref 70–99)
Glucose-Capillary: 118 mg/dL — ABNORMAL HIGH (ref 70–99)
Glucose-Capillary: 129 mg/dL — ABNORMAL HIGH (ref 70–99)

## 2019-03-09 LAB — TRIGLYCERIDES: Triglycerides: 120 mg/dL (ref <150)

## 2019-03-09 LAB — ABO/RH: ABO/RH(D): O POS

## 2019-03-09 MED ORDER — VITAL HIGH PROTEIN PO LIQD: 1000.0000 mL | ORAL | Status: DC

## 2019-03-09 MED ORDER — PIVOT 1.5 CAL PO LIQD: 1000.0000 mL | ORAL | Status: DC

## 2019-03-09 MED ORDER — SODIUM CHLORIDE 0.9% FLUSH: 10.0000 mL | INTRAVENOUS | Status: DC | PRN

## 2019-03-09 MED ORDER — SODIUM CHLORIDE 0.9% FLUSH
10.0000 mL | Freq: Two times a day (BID) | INTRAVENOUS | Status: DC
  Administered 2019-03-09 (×2): 10 mL

## 2019-03-09 MED ORDER — OXYCODONE HCL 5 MG/5ML PO SOLN
5.0000 mg | ORAL | Status: DC | PRN
  Administered 2019-03-09: 5 mg via ORAL
  Administered 2019-03-09: 10 mg via ORAL
  Administered 2019-03-10: 5 mg via ORAL
  Administered 2019-03-10 – 2019-03-12 (×7): 10 mg via ORAL
  Filled 2019-03-09: qty 10
  Filled 2019-03-09: qty 5
  Filled 2019-03-09 (×5): qty 10
  Filled 2019-03-09: qty 5
  Filled 2019-03-09 (×2): qty 10

## 2019-03-09 MED ORDER — ENOXAPARIN SODIUM 30 MG/0.3ML ~~LOC~~ SOLN
30.0000 mg | Freq: Two times a day (BID) | SUBCUTANEOUS | Status: DC
  Administered 2019-03-09 – 2019-03-12 (×7): 30 mg via SUBCUTANEOUS
  Filled 2019-03-09 (×7): qty 0.3

## 2019-03-09 MED ORDER — PRO-STAT SUGAR FREE PO LIQD: 30.0000 mL | Freq: Two times a day (BID) | ORAL | Status: DC

## 2019-03-09 MED ORDER — LACTATED RINGERS IV SOLN
INTRAVENOUS | Status: DC
  Administered 2019-03-09 (×2): via INTRAVENOUS

## 2019-03-09 NOTE — Progress Notes (Signed)
Trauma Critical Care Follow Up Note  Subjective:    Overnight Issues: NAEON  Objective:  Vital signs for last 24 hours: Temp:  [95.2 F (35.1 C)-99.9 F (37.7 C)] 99.9 F (37.7 C) (11/20 0800) Pulse Rate:  [74-132] 86 (11/20 0800) Resp:  [16-27] 16 (11/20 0800) BP: (88-151)/(54-114) 123/74 (11/20 0800) SpO2:  [97 %-100 %] 100 % (11/20 0800) FiO2 (%):  [30 %-40 %] 30 % (11/20 0800) Weight:  [61.9 kg-74.8 kg] 61.9 kg (11/19 1638)  Hemodynamic parameters for last 24 hours:    Intake/Output from previous day: 11/19 0701 - 11/20 0700 In: 6722.1 [I.V.:5526.1; Blood:646; IV Piggyback:550] Out: 1975 [Urine:1975]  Intake/Output this shift: Total I/O In: 128.1 [I.V.:128.1] Out: -   Vent settings for last 24 hours: Vent Mode: PRVC FiO2 (%):  [30 %-40 %] 30 % Set Rate:  [16 bmp] 16 bmp Vt Set:  [580 mL] 580 mL PEEP:  [5 cmH20] 5 cmH20 Plateau Pressure:  [18 cmH20-20 cmH20] 20 cmH20  Physical Exam:  Gen: no distress Neuro: RASS -5 HEENT: intubated Neck: c-collar in place CV: RRR Pulm: unlabored breathing, mechanically ventilated Abd: soft, nontender GU: clear, yellow urine Extr: wwp, no edema   Results for orders placed or performed during the hospital encounter of 03/08/19 (from the past 24 hour(s))  Sample to Blood Bank     Status: None   Collection Time: 03/08/19 11:24 AM  Result Value Ref Range   Blood Bank Specimen SAMPLE AVAILABLE FOR TESTING    Sample Expiration      03/11/2019,2359 Performed at [facility], [address], [place], [place] [address]   Lactic acid, plasma     Status: Abnormal   Collection Time: 03/08/19 11:30 AM  Result Value Ref Range   Lactic Acid, Venous 3.5 (HH) 0.5 - 1.9 mmol/L  Type and screen Ordered by PROVIDER DEFAULT     Status: None (Preliminary result)   Collection Time: 03/08/19 11:32 AM  Result Value Ref Range   ABO/RH(D) O POS    Antibody Screen NEG    Sample Expiration 03/11/2019,2359    Unit Number  [id number]    Blood Component Type RED CELLS,LR    Unit division 00    Status of Unit ISSUED,FINAL    Transfusion Status OK TO TRANSFUSE    Crossmatch Result COMPATIBLE    Unit Number [id number]    Blood Component Type RBC LR PHER1    Unit division 00    Status of Unit ISSUED,FINAL    Transfusion Status OK TO TRANSFUSE    Crossmatch Result      Compatible Performed at [facility], [address], [place], [place] [address]    Unit Number [id number]    Blood Component Type RED CELLS,LR    Unit division 00    Status of Unit ISSUED,FINAL    Transfusion Status OK TO TRANSFUSE    Crossmatch Result Compatible    Unit Number [id number]    Blood Component Type RBC LR PHER1    Unit division 00    Status of Unit ALLOCATED    Transfusion Status OK TO TRANSFUSE    Crossmatch Result Compatible    Unit Number [id number]    Blood Component Type RBC LR PHER1    Unit division 00    Status of Unit ALLOCATED    Transfusion Status OK TO TRANSFUSE    Crossmatch Result Compatible   ABO/Rh     Status: None (Preliminary result)   Collection  Time: 03/08/19 11:32 AM  Result Value Ref Range   ABO/RH(D)      O POS Performed at [facility], [address], [place], [place] [address]   Ethanol     Status: None   Collection Time: 03/08/19 11:33 AM  Result Value Ref Range   Alcohol, Ethyl (B) <10 <10 mg/dL  CDS serology     Status: None   Collection Time: 03/08/19 11:35 AM  Result Value Ref Range   CDS serology specimen      SPECIMEN WILL BE HELD FOR 14 DAYS IF TESTING IS REQUIRED  Comprehensive metabolic panel     Status: Abnormal   Collection Time: 03/08/19 11:35 AM  Result Value Ref Range   Sodium 140 135 - 145 mmol/L   Potassium 3.2 (L) 3.5 - 5.1 mmol/L   Chloride 104 98 - 111 mmol/L   CO2 25 22 - 32 mmol/L   Glucose, Bld 173 (H) 70 - 99 mg/dL   BUN 8 8 - 23 mg/dL   Creatinine, Ser 7.84 0.61 - 1.24 mg/dL   Calcium 9.1 8.9 - 69.6 mg/dL   Total Protein 7.0  6.5 - 8.1 g/dL   Albumin 4.3 3.5 - 5.0 g/dL   AST 295 (H) 15 - 41 U/L   ALT 115 (H) 0 - 44 U/L   Alkaline Phosphatase 147 (H) 38 - 126 U/L   Total Bilirubin 0.9 0.3 - 1.2 mg/dL   GFR calc non Af Amer 44 (L) >60 mL/min   GFR calc Af Amer 51 (L) >60 mL/min   Anion gap 11 5 - 15  CBC     Status: Abnormal   Collection Time: 03/08/19 11:35 AM  Result Value Ref Range   WBC 20.8 (H) 4.0 - 10.5 K/uL   RBC 4.87 4.22 - 5.81 MIL/uL   Hemoglobin 15.5 13.0 - 17.0 g/dL   HCT 28.4 13.2 - 44.0 %   MCV 90.8 80.0 - 100.0 fL   MCH 31.8 26.0 - 34.0 pg   MCHC 35.1 30.0 - 36.0 g/dL   RDW 10.2 72.5 - 36.6 %   Platelets 341 150 - 400 K/uL   nRBC 0.0 0.0 - 0.2 %  Protime-INR     Status: Abnormal   Collection Time: 03/08/19 11:35 AM  Result Value Ref Range   Prothrombin Time 15.5 (H) 11.4 - 15.2 seconds   INR 1.2 0.8 - 1.2  I-stat chem 8, ED     Status: Abnormal   Collection Time: 03/08/19 11:37 AM  Result Value Ref Range   Sodium 142 135 - 145 mmol/L   Potassium 3.2 (L) 3.5 - 5.1 mmol/L   Chloride 102 98 - 111 mmol/L   BUN 11 8 - 23 mg/dL   Creatinine, Ser 4.40 0.61 - 1.24 mg/dL   Glucose, Bld 347 (H) 70 - 99 mg/dL   Calcium, Ion 4.25 9.56 - 1.40 mmol/L   TCO2 29 22 - 32 mmol/L   Hemoglobin 15.6 13.0 - 17.0 g/dL   HCT 38.7 56.4 - 33.2 %  POC SARS Coronavirus 2 Ag-ED -     Status: None   Collection Time: 03/08/19 11:57 AM  Result Value Ref Range   SARS Coronavirus 2 Ag NEGATIVE NEGATIVE  Prepare RBC     Status: None   Collection Time: 03/08/19 12:21 PM  Result Value Ref Range   Order Confirmation      ORDER PROCESSED BY BLOOD BANK Performed at [facility], [address] [place], [place] [address]   Prepare fresh frozen plasma     Status: None   Collection Time: 03/08/19 12:21 PM  Result Value Ref Range   Unit Number [id number]    Blood Component Type LIQ PLASMA    Unit division 00    Status of Unit ISSUED,FINAL    Unit tag comment VERBAL ORDERS PER DR BERO    Transfusion  Status      OK TO TRANSFUSE Performed at [facility], [address] [place], [place] [address]    Unit Number [id number]    Blood Component Type THW PLS APHR    Unit division B0    Status of Unit [facility]    Transfusion Status OK TO TRANSFUSE    Unit Number [id number]    Blood Component Type THAWED PLASMA    Unit division 00    [facility]    Transfusion Status OK TO TRANSFUSE    Unit Number [id number]    Blood Component Type THAWED PLASMA    Unit division 00    [facility]    Transfusion Status OK TO TRANSFUSE    Unit Number [id number]    Blood Component Type THAWED PLASMA    Unit division 00    [facility]    Transfusion Status OK TO TRANSFUSE    Unit Number [id number]    Blood Component Type THAWED PLASMA    Unit division 00    [facility]    Transfusion Status OK TO TRANSFUSE   POCT I-Stat EG7     Status: Abnormal   Collection Time: 03/08/19 12:22 PM  Result Value Ref Range   pH, Ven 7.294 7.250 - 7.430   pCO2, Ven 59.2 44.0 - 60.0 mmHg   pO2, Ven 33.0 32.0 - 45.0 mmHg   Bicarbonate 28.7 (H) 20.0 - 28.0 mmol/L   TCO2 30 22 - 32 mmol/L   O2 Saturation 56.0 %   Acid-Base Excess 1.0 0.0 - 2.0 mmol/L   Sodium 142 135 - 145 mmol/L   Potassium 3.1 (L) 3.5 - 5.1 mmol/L   Calcium, Ion 1.21 1.15 - 1.40 mmol/L   HCT 46.0 39.0 - 52.0 %   Hemoglobin 15.6 13.0 - 17.0 g/dL   Patient temperature HIDE    Sample type VENOUS    Comment MD NOTIFIED, SUGGEST RECOLLECT   Urinalysis, Routine w reflex microscopic     Status: Abnormal   Collection Time: 03/08/19 12:25 PM  Result Value Ref Range   Color, Urine YELLOW YELLOW   APPearance HAZY (A) CLEAR   Specific Gravity, Urine 1.043 (H) 1.005 - 1.030   pH 8.0 5.0 - 8.0   Glucose, UA 50 (A) NEGATIVE mg/dL   Hgb urine dipstick SMALL (A) NEGATIVE   Bilirubin Urine NEGATIVE NEGATIVE   Ketones, ur NEGATIVE NEGATIVE mg/dL   Protein, ur  063100 (A) NEGATIVE mg/dL   Nitrite NEGATIVE NEGATIVE   Leukocytes,Ua NEGATIVE NEGATIVE   RBC / HPF 21-50 0 - 5 RBC/hpf   WBC, UA 6-10 0 - 5 WBC/hpf   Bacteria, UA RARE (A) NONE SEEN   Squamous Epithelial / LPF 0-5 0 - 5  I-STAT 7, (LYTES, BLD GAS, ICA, H+H)     Status: Abnormal   Collection Time: 03/08/19 12:37 PM  Result Value Ref Range   pH, Arterial 7.416 7.350 - 7.450   pCO2 arterial 33.0 32.0 -  48.0 mmHg   pO2, Arterial 165.0 (H) 83.0 - 108.0 mmHg   Bicarbonate 21.2 20.0 - 28.0 mmol/L   TCO2 22 22 - 32 mmol/L   O2 Saturation 100.0 %   Acid-base deficit 3.0 (H) 0.0 - 2.0 mmol/L   Sodium 138 135 - 145 mmol/L   Potassium 4.4 3.5 - 5.1 mmol/L   Calcium, Ion 1.13 (L) 1.15 - 1.40 mmol/L   HCT 37.0 (L) 39.0 - 52.0 %   Hemoglobin 12.6 (L) 13.0 - 17.0 g/dL   Patient temperature 40.9 F    Sample type ARTERIAL   I-STAT, chem 8     Status: Abnormal   Collection Time: 03/08/19  3:17 PM  Result Value Ref Range   Sodium 143 135 - 145 mmol/L   Potassium 4.2 3.5 - 5.1 mmol/L   Chloride 105 98 - 111 mmol/L   BUN 9 8 - 23 mg/dL   Creatinine, Ser 8.11 0.61 - 1.24 mg/dL   Glucose, Bld 99 70 - 99 mg/dL   Calcium, Ion 9.14 7.82 - 1.40 mmol/L   TCO2 20 (L) 22 - 32 mmol/L   Hemoglobin 9.5 (L) 13.0 - 17.0 g/dL   HCT 95.6 (L) 21.3 - 08.6 %  POCT I-Stat EG7     Status: Abnormal   Collection Time: 03/08/19  3:30 PM  Result Value Ref Range   pH, Ven 7.158 (LL) 7.250 - 7.430   pCO2, Ven 26.3 (L) 44.0 - 60.0 mmHg   pO2, Ven 49.0 (H) 32.0 - 45.0 mmHg   Bicarbonate 9.3 (L) 20.0 - 28.0 mmol/L   TCO2 10 (L) 22 - 32 mmol/L   O2 Saturation 74.0 %   Acid-base deficit 18.0 (H) 0.0 - 2.0 mmol/L   Sodium 113 (LL) 135 - 145 mmol/L   Potassium 2.7 (LL) 3.5 - 5.1 mmol/L   Calcium, Ion 0.93 (L) 1.15 - 1.40 mmol/L   HCT 19.0 (L) 39.0 - 52.0 %   Hemoglobin 6.5 (LL) 13.0 - 17.0 g/dL   Patient temperature HIDE    Sample type VENOUS   POCT I-Stat EG7     Status: Abnormal   Collection Time: 03/08/19  4:18 PM   Result Value Ref Range   pH, Ven 7.291 7.250 - 7.430   pCO2, Ven 33.9 (L) 44.0 - 60.0 mmHg   pO2, Ven 44.0 32.0 - 45.0 mmHg   Bicarbonate 16.4 (L) 20.0 - 28.0 mmol/L   TCO2 17 (L) 22 - 32 mmol/L   O2 Saturation 74.0 %   Acid-base deficit 9.0 (H) 0.0 - 2.0 mmol/L   Sodium 147 (H) 135 - 145 mmol/L   Potassium 3.7 3.5 - 5.1 mmol/L   Calcium, Ion 0.81 (LL) 1.15 - 1.40 mmol/L   HCT 24.0 (L) 39.0 - 52.0 %   Hemoglobin 8.2 (L) 13.0 - 17.0 g/dL   Patient temperature HIDE    Sample type VENOUS   MRSA PCR Screening     Status: None   Collection Time: 03/08/19  4:36 PM   Specimen: Nasal Mucosa; Nasopharyngeal  Result Value Ref Range   MRSA by PCR NEGATIVE NEGATIVE  CBC     Status: Abnormal   Collection Time: 03/08/19  4:47 PM  Result Value Ref Range   WBC 12.4 (H) 4.0 - 10.5 K/uL   RBC 4.54 4.22 - 5.81 MIL/uL   Hemoglobin 13.7 13.0 - 17.0 g/dL   HCT 57.8 46.9 - 62.9 %   MCV 87.7 80.0 - 100.0 fL   MCH 30.2 26.0 -  34.0 pg   MCHC 34.4 30.0 - 36.0 g/dL   RDW 16.1 09.6 - 04.5 %   Platelets 157 150 - 400 K/uL   nRBC 0.0 0.0 - 0.2 %  Glucose, capillary     Status: None   Collection Time: 03/08/19  7:24 PM  Result Value Ref Range   Glucose-Capillary 90 70 - 99 mg/dL  CBC     Status: Abnormal   Collection Time: 03/08/19 10:01 PM  Result Value Ref Range   WBC 6.9 4.0 - 10.5 K/uL   RBC 3.91 (L) 4.22 - 5.81 MIL/uL   Hemoglobin 11.9 (L) 13.0 - 17.0 g/dL   HCT 40.9 (L) 81.1 - 91.4 %   MCV 85.9 80.0 - 100.0 fL   MCH 30.4 26.0 - 34.0 pg   MCHC 35.4 30.0 - 36.0 g/dL   RDW 78.2 95.6 - 21.3 %   Platelets 143 (L) 150 - 400 K/uL   nRBC 0.0 0.0 - 0.2 %  CBC     Status: Abnormal   Collection Time: 03/09/19  5:08 AM  Result Value Ref Range   WBC 6.5 4.0 - 10.5 K/uL   RBC 3.64 (L) 4.22 - 5.81 MIL/uL   Hemoglobin 11.1 (L) 13.0 - 17.0 g/dL   HCT 08.6 (L) 57.8 - 46.9 %   MCV 86.8 80.0 - 100.0 fL   MCH 30.5 26.0 - 34.0 pg   MCHC 35.1 30.0 - 36.0 g/dL   RDW 62.9 52.8 - 41.3 %   Platelets 141 (L)  150 - 400 K/uL   nRBC 0.0 0.0 - 0.2 %  Basic metabolic panel     Status: Abnormal   Collection Time: 03/09/19  5:08 AM  Result Value Ref Range   Sodium 142 135 - 145 mmol/L   Potassium 3.4 (L) 3.5 - 5.1 mmol/L   Chloride 112 (H) 98 - 111 mmol/L   CO2 22 22 - 32 mmol/L   Glucose, Bld 89 70 - 99 mg/dL   BUN 8 8 - 23 mg/dL   Creatinine, Ser 2.44 0.61 - 1.24 mg/dL   Calcium 8.4 (L) 8.9 - 10.3 mg/dL   GFR calc non Af Amer 52 (L) >60 mL/min   GFR calc Af Amer 60 (L) >60 mL/min   Anion gap 8 5 - 15  Triglycerides     Status: None   Collection Time: 03/09/19  5:08 AM  Result Value Ref Range   Triglycerides 120 <150 mg/dL  I-STAT 7, (LYTES, BLD GAS, ICA, H+H)     Status: Abnormal   Collection Time: 03/09/19  5:13 AM  Result Value Ref Range   pH, Arterial 7.437 7.350 - 7.450   pCO2 arterial 31.3 (L) 32.0 - 48.0 mmHg   pO2, Arterial 124.0 (H) 83.0 - 108.0 mmHg   Bicarbonate 21.0 20.0 - 28.0 mmol/L   TCO2 22 22 - 32 mmol/L   O2 Saturation 99.0 %   Acid-base deficit 2.0 0.0 - 2.0 mmol/L   Sodium 144 135 - 145 mmol/L   Potassium 3.3 (L) 3.5 - 5.1 mmol/L   Calcium, Ion 1.21 1.15 - 1.40 mmol/L   HCT 31.0 (L) 39.0 - 52.0 %   Hemoglobin 10.5 (L) 13.0 - 17.0 g/dL   Patient temperature 01.0 C    Collection site RADIAL, ALLEN'S TEST ACCEPTABLE    Drawn by RT    Sample type ARTERIAL     Assessment & Plan: Present on Admission: . Spleen laceration extending into parenchyma with open wound into cavity  LOS: 1 day   Additional comments:I reviewed the patient's new clinical lab test results.   and I reviewed the patients new imaging test results.    16M s/p fall from 25+ft  G3 splenic lac with parasplenic hematoma and active extrav - s/p AE 11/19 Left iliac wing fracture with surrounding hematoma - Ortho consulted, non-op mgmt Left SDH - NSGY on board, repeat head CT this AM stable Left Pulm Contusion/small PTX/HPTX - pulm toilet Left Rib Fx 6, 7, 8 - Multimodal pain control  T7, T8,  T9 and L3 TP fxs - Multimodal pain control  VDRF - wean sedation to try to extubate today Foley - d/c today FEN - NPO, OGT, change MIVF to LR, replete K VTE - SCDs, start LMWH  Critical Care Total Time: 35 minutes  Jesusita Oka, MD Trauma & General Surgery Please use [email or web address] to contact on call provider  03/09/2019  *Care during the described time interval was provided by me. I have reviewed this patient's available data, including medical history, events of note, physical examination and test results as part of my evaluation.

## 2019-03-09 NOTE — H&P (Signed)
Referring Physician(s): Trauma Janee Morn  Supervising Physician: Ruel Favors  Patient Status:  [facility] - In-pt  Chief Complaint:  Splenic laceration S/P splenic embolization  Subjective:  Patient remains sedated and intubated.  Allergies: Patient has no known allergies.  Medications: Prior to Admission medications   Not on File     Vital Signs: BP 123/74 (BP Location: Left Arm)    Pulse 86    Temp 99.9 F (37.7 C) (Bladder)    Resp 16    Ht  (1.778 m)    Wt 61.9 kg    SpO2 100%    BMI 19.58 kg/m   Physical Exam Sedated Intubated on vent Right groin stick site looks good, no hematoma, no pseudoaneurysm  Imaging: Ct Head Wo Contrast  Result Date: 03/09/2019 CLINICAL DATA:  Follow-up known intracranial hemorrhage EXAM: CT HEAD WITHOUT CONTRAST TECHNIQUE: Contiguous axial images were obtained from the base of the skull through the vertex without intravenous contrast. COMPARISON:  Yesterday FINDINGS: Brain: Subdural hematoma along the left cerebral convexity has diffused more inferiorly and posteriorly, reaching the posterior interhemispheric fissure. Maximal thickness has likewise decreased to 1 mm compared to 4 mm previously. There is 1-2 mm of midline shift. No new site of hemorrhage, infarct, hydrocephalus, or brain edema. Vascular: Negative Skull: Left posterior scalp swelling.  No calvarial fracture. Sinuses/Orbits: Nasopharyngeal and sinus fluid/mucosal thickening in the setting of intubation. IMPRESSION: Left subdural hematoma has diffused along the cerebral convexity with maximal thickness decreased from 4 to 1 mm and midline shift decreased to 2 mm. Electronically Signed   By: Marnee Spring M.D.   On: 03/09/2019 04:28   Ct Head Wo Contrast  Result Date: 03/08/2019 CLINICAL DATA:  Fall.  Found down. EXAM: CT HEAD WITHOUT CONTRAST CT CERVICAL SPINE WITHOUT CONTRAST TECHNIQUE: Multidetector CT imaging of the head and cervical spine was performed following the  standard protocol without intravenous contrast. Multiplanar CT image reconstructions of the cervical spine were also generated. COMPARISON:  None. FINDINGS: CT HEAD FINDINGS Brain: Small left cerebral convexity acute subdural hematoma measuring up to 4-5 mm in maximal thickness. Minimal mass effect on the adjacent sulci with 3 mm left-to-right midline shift. No herniation. No evidence of acute infarction, hydrocephalus, or mass lesion. Vascular: No hyperdense vessel or unexpected calcification. Skull: Normal. Negative for fracture or focal lesion. Sinuses/Orbits: No acute finding. Other: None. CT CERVICAL SPINE FINDINGS Alignment: Straightening of the normal cervical lordosis. No traumatic malalignment. Skull base and vertebrae: No acute fracture. No primary bone lesion or focal pathologic process. Soft tissues and spinal canal: No prevertebral fluid or swelling. No visible canal hematoma. Disc levels:  Normal. Upper chest: Please see separate CT chest report from same day. Other: None. IMPRESSION: 1. Small left cerebral convexity acute subdural hematoma measuring up to 4-5 mm in maximal thickness. 3 mm left-to-right midline shift. 2.  No acute cervical spine fracture. Critical Value/emergent results were discussed in person at the time of interpretation on 03/08/2019 at 12:10 pm with provider Violeta Gelinas, who verbally acknowledged these results. Electronically Signed   By: Obie Dredge M.D.   On: 03/08/2019 12:23   Ct Chest W Contrast  Result Date: 03/08/2019 CLINICAL DATA:  Fall.  Found down. EXAM: CT CHEST, ABDOMEN, AND PELVIS WITH CONTRAST TECHNIQUE: Multidetector CT imaging of the chest, abdomen and pelvis was performed following the standard protocol during bolus administration of intravenous contrast. CONTRAST:  OMNIPAQUE IOHEXOL 300 MG/ML  SOLN COMPARISON:  None. FINDINGS: CT CHEST FINDINGS  Cardiovascular: No significant vascular findings. Normal heart size. No pericardial effusion. No  thoracic aortic aneurysm or dissection. Normal central pulmonary arteries. Mediastinum/Nodes: Residual thymus in the anterior mediastinum. No pneumomediastinum. No enlarged mediastinal, hilar, or axillary lymph nodes. Calcified right hilar lymph nodes. Thyroid gland and esophagus demonstrate no significant findings. Enteric tube within the esophagus. Lungs/Pleura: Endotracheal tube tip 1.1 cm above the carina. Small left hemopneumothorax. Debris and hemorrhage within the left lower lobe bronchi. Partial collapse of the left lower lobe with extensive contusion and small traumatic pneumatocele consistent with laceration. There are a few scattered contusions in the posterior left upper lobe. The right lung is clear. Musculoskeletal: Acute nondisplaced fractures of the left posterolateral sixth, seventh, and eighth ribs. Additional acute nondisplaced fracture of the left posterior eighth rib. Acute nondisplaced fractures of the left T6-T9 transverse processes. No vertebral body fracture. CT ABDOMEN PELVIS FINDINGS Hepatobiliary: No focal hepatic injury. Trace perihepatic hematoma at the inferior tip of the liver. The gallbladder is unremarkable. No biliary dilatation. Pancreas: Unremarkable. No pancreatic ductal dilatation or surrounding inflammatory changes. Spleen: Extensive splenic lacerations with 8 mm focus of intraparenchymal vascular contrast (series 3, image 51; series 5, image 54) that slightly increases in size on delayed imaging, consistent with active bleeding. Small perisplenic hematoma. Adrenals/Urinary Tract: No adrenal hemorrhage or renal injury identified. Bladder is unremarkable. Stomach/Bowel: Enteric tube in the stomach. The stomach is within normal limits. No bowel wall thickening, distention, or surrounding inflammatory changes. Normal appendix. Vascular/Lymphatic: No significant vascular findings are present. No enlarged abdominal or pelvic lymph nodes. Prominent mesenteric lymph nodes are likely  reactive. Reproductive: Prostate is unremarkable. Other: Small amount of hemorrhage in the pelvis. No pneumoperitoneum. Musculoskeletal: Acute mildly displaced fracture of the left iliac wing involving the physis with surrounding hematoma. Acute nondisplaced fracture of the left L3 transverse process. IMPRESSION: Chest: 1. Small left hemopneumothorax. 2. Partial collapse of left lower lobe with extensive contusion and small traumatic pneumatocele consistent with laceration. Debris and hemorrhage within the left lower lobe bronchi. 3. Few scattered contusions in the posterior left upper lobe. 4. Acute nondisplaced fractures of the left sixth through eighth ribs and left T6-T9 transverse processes. For Abdomen and pelvis: 1. Grade IV splenic injury with 8 mm focus of intraparenchymal vascular contrast that slightly increases in size on delayed imaging, consistent with active bleeding. Small perisplenic hematoma. 2. Trace perihepatic hematoma at the inferior tip of the liver without definite underlying hepatic injury. 3. Acute mildly displaced fracture of the left iliac wing involving the physis. 4. Acute nondisplaced fracture of the left L3 transverse process. Critical Value/emergent results were discussed in person at the time of interpretation on 03/08/2019 at 12:10 pm with provider Georganna Skeans, who verbally acknowledged these results. Electronically Signed   By: Titus Dubin M.D.   On: 03/08/2019 12:51   Ct Cervical Spine Wo Contrast  Result Date: 03/08/2019 CLINICAL DATA:  Fall.  Found down. EXAM: CT HEAD WITHOUT CONTRAST CT CERVICAL SPINE WITHOUT CONTRAST TECHNIQUE: Multidetector CT imaging of the head and cervical spine was performed following the standard protocol without intravenous contrast. Multiplanar CT image reconstructions of the cervical spine were also generated. COMPARISON:  None. FINDINGS: CT HEAD FINDINGS Brain: Small left cerebral convexity acute subdural hematoma measuring up to 4-5 mm  in maximal thickness. Minimal mass effect on the adjacent sulci with 3 mm left-to-right midline shift. No herniation. No evidence of acute infarction, hydrocephalus, or mass lesion. Vascular: No hyperdense vessel or unexpected calcification. Skull: Normal. Negative  for fracture or focal lesion. Sinuses/Orbits: No acute finding. Other: None. CT CERVICAL SPINE FINDINGS Alignment: Straightening of the normal cervical lordosis. No traumatic malalignment. Skull base and vertebrae: No acute fracture. No primary bone lesion or focal pathologic process. Soft tissues and spinal canal: No prevertebral fluid or swelling. No visible canal hematoma. Disc levels:  Normal. Upper chest: Please see separate CT chest report from same day. Other: None. IMPRESSION: 1. Small left cerebral convexity acute subdural hematoma measuring up to 4-5 mm in maximal thickness. 3 mm left-to-right midline shift. 2.  No acute cervical spine fracture. Critical Value/emergent results were discussed in person at the time of interpretation on 03/08/2019 at 12:10 pm with provider Violeta Gelinas, who verbally acknowledged these results. Electronically Signed   By: Obie Dredge M.D.   On: 03/08/2019 12:23   Ct Abdomen Pelvis W Contrast  Result Date: 03/08/2019 CLINICAL DATA:  Fall.  Found down. EXAM: CT CHEST, ABDOMEN, AND PELVIS WITH CONTRAST TECHNIQUE: Multidetector CT imaging of the chest, abdomen and pelvis was performed following the standard protocol during bolus administration of intravenous contrast. CONTRAST:  OMNIPAQUE IOHEXOL 300 MG/ML  SOLN COMPARISON:  None. FINDINGS: CT CHEST FINDINGS Cardiovascular: No significant vascular findings. Normal heart size. No pericardial effusion. No thoracic aortic aneurysm or dissection. Normal central pulmonary arteries. Mediastinum/Nodes: Residual thymus in the anterior mediastinum. No pneumomediastinum. No enlarged mediastinal, hilar, or axillary lymph nodes. Calcified right hilar lymph nodes.  Thyroid gland and esophagus demonstrate no significant findings. Enteric tube within the esophagus. Lungs/Pleura: Endotracheal tube tip 1.1 cm above the carina. Small left hemopneumothorax. Debris and hemorrhage within the left lower lobe bronchi. Partial collapse of the left lower lobe with extensive contusion and small traumatic pneumatocele consistent with laceration. There are a few scattered contusions in the posterior left upper lobe. The right lung is clear. Musculoskeletal: Acute nondisplaced fractures of the left posterolateral sixth, seventh, and eighth ribs. Additional acute nondisplaced fracture of the left posterior eighth rib. Acute nondisplaced fractures of the left T6-T9 transverse processes. No vertebral body fracture. CT ABDOMEN PELVIS FINDINGS Hepatobiliary: No focal hepatic injury. Trace perihepatic hematoma at the inferior tip of the liver. The gallbladder is unremarkable. No biliary dilatation. Pancreas: Unremarkable. No pancreatic ductal dilatation or surrounding inflammatory changes. Spleen: Extensive splenic lacerations with 8 mm focus of intraparenchymal vascular contrast (series 3, image 51; series 5, image 54) that slightly increases in size on delayed imaging, consistent with active bleeding. Small perisplenic hematoma. Adrenals/Urinary Tract: No adrenal hemorrhage or renal injury identified. Bladder is unremarkable. Stomach/Bowel: Enteric tube in the stomach. The stomach is within normal limits. No bowel wall thickening, distention, or surrounding inflammatory changes. Normal appendix. Vascular/Lymphatic: No significant vascular findings are present. No enlarged abdominal or pelvic lymph nodes. Prominent mesenteric lymph nodes are likely reactive. Reproductive: Prostate is unremarkable. Other: Small amount of hemorrhage in the pelvis. No pneumoperitoneum. Musculoskeletal: Acute mildly displaced fracture of the left iliac wing involving the physis with surrounding hematoma. Acute  nondisplaced fracture of the left L3 transverse process. IMPRESSION: Chest: 1. Small left hemopneumothorax. 2. Partial collapse of left lower lobe with extensive contusion and small traumatic pneumatocele consistent with laceration. Debris and hemorrhage within the left lower lobe bronchi. 3. Few scattered contusions in the posterior left upper lobe. 4. Acute nondisplaced fractures of the left sixth through eighth ribs and left T6-T9 transverse processes. For Abdomen and pelvis: 1. Grade IV splenic injury with 8 mm focus of intraparenchymal vascular contrast that slightly increases in size on  delayed imaging, consistent with active bleeding. Small perisplenic hematoma. 2. Trace perihepatic hematoma at the inferior tip of the liver without definite underlying hepatic injury. 3. Acute mildly displaced fracture of the left iliac wing involving the physis. 4. Acute nondisplaced fracture of the left L3 transverse process. Critical Value/emergent results were discussed in person at the time of interpretation on 03/08/2019 at 12:10 pm with provider Violeta Gelinas, who verbally acknowledged these results. Electronically Signed   By: Obie Dredge M.D.   On: 03/08/2019 12:51   Ir Angiogram Visceral Selective  Result Date: 03/09/2019 INDICATION: Patient found unconscious after fall. Patient was brought to the emergency department and intubated. Imaging workup demonstrated a splenic laceration and evidence of active bleeding.  EXAM: Visceral angiography: Celiac trunk, SMA and splenic artery  Coil embolization of splenic artery  Ultrasound guidance for vascular access x 2  Placement of central venous catheter  MEDICATIONS: Ancef 2 g. The antibiotic was administered within 1 hour of the procedure  ANESTHESIA/SEDATION: General anesthesia.  CONTRAST:  88 mL Omnipaque 300  FLUOROSCOPY TIME:  Fluoroscopy Time: 7 minutes and 48 seconds, 129 mGy  COMPLICATIONS: None immediate.  PROCEDURE: Emergency consent was  obtained for the procedure. A time out was performed prior to the initiation of the procedure. Maximal barrier sterile technique utilized including caps, mask, sterile gowns, sterile gloves, large sterile drape, hand hygiene, and Betadine prep.  Both groins were prepped and draped in sterile fashion. Anesthesia requested a central line placement. Ultrasound confirmed a patent left common femoral vein. Image was saved for documentation. Needle was directed in the left common femoral vein with ultrasound guidance. Wire was advanced centrally. The tract was dilated to accommodate a dual lumen central venous catheter. Catheter was advanced to the left common iliac vein region. Both lumens aspirated and flushed well. Catheter was sutured to the skin.  Ultrasound demonstrated a patent right common femoral artery. Ultrasound image was saved for documentation. Small skin incision was made. 21 gauge needle was directed into the right common femoral artery with ultrasound guidance. Wire was advanced and micropuncture dilator set was placed. 5 [place] vascular sheath was placed with a Bentson wire. A C2 catheter was used to cannulate the celiac trunk. C2 catheter was then used to cannulate the SMA. Splenic artery was identified coming off the SMA. Splenic artery was cannulated with a Glidewire and the C2 catheter was advanced into the splenic artery. Dedicated splenic artery arteriography was performed. The splenic artery was embolized with Interlock coils. Two 6 mm x 10 cm coils were placed and one 6 mm x 20 cm coil was placed. 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 Catheter was removed. Angiogram was performed through the right groin sheath. Right groin sheath was removed using an ExoSeal closure device for hemostasis. Bandage placed over the puncture site.  FINDINGS: Left femoral central venous catheter tip in the left common iliac vein region.  Celiac trunk: Variant anatomy with a common  hepatic artery that supplies the GDA and the left hepatic artery.  SMA: Variant anatomy with the splenic artery coming off the SMA trunk. There also appears to be a replaced right hepatic artery coming off the SMA. Main SMA is patent. Splenic vein, SMV and portal vein are patent.  Splenic artery: There is a large defect in the peripheral aspect of the mid spleen compatible with a large laceration. No active contrast extravasation identified. Splenic vein was patent. The main trunk of the splenic artery was embolized with  3 coils. Following the coil embolization, there was no flow within the main trunk of the splenic artery but there was delayed filling of the distal splenic artery related to collateral flow through pancreatic branches.  IMPRESSION: 1. Successful coil embolization of the main splenic artery for treatment of the splenic laceration. There was no evidence for active splenic bleeding at the time of the angiogram. Main splenic artery embolization was performed in order to decrease the pulse pressure to the spleen. Residual flow to the spleen through collateral branches following coil embolization of the main splenic artery. 2. Variant visceral artery anatomy. The splenic artery and right hepatic artery originate from the SMA. 3. Placement of a left femoral central venous line.   Electronically Signed   By: Richarda Overlie M.D.   On: 03/08/2019 19:47   Ir Angiogram Visceral Selective  Result Date: 03/08/2019 INDICATION: Patient found unconscious after fall. Patient was brought to the emergency department and intubated. Imaging workup demonstrated a splenic laceration and evidence of active bleeding. EXAM: Visceral angiography: Celiac trunk, SMA and splenic artery Coil embolization of splenic artery Ultrasound guidance for vascular access x 2 Placement of central venous catheter MEDICATIONS: Ancef 2 g. The antibiotic was administered within 1 hour of the procedure ANESTHESIA/SEDATION: General  anesthesia. CONTRAST:  88 mL Omnipaque 300 FLUOROSCOPY TIME:  Fluoroscopy Time: 7 minutes and 48 seconds, 129 mGy COMPLICATIONS: None immediate. PROCEDURE: Emergency consent was obtained for the procedure. A time out was performed prior to the initiation of the procedure. Maximal barrier sterile technique utilized including caps, mask, sterile gowns, sterile gloves, large sterile drape, hand hygiene, and Betadine prep. Both groins were prepped and draped in sterile fashion. Anesthesia requested a central line placement. Ultrasound confirmed a patent left common femoral vein. Image was saved for documentation. Needle was directed in the left common femoral vein with ultrasound guidance. Wire was advanced centrally. The tract was dilated to accommodate a dual lumen central venous catheter. Catheter was advanced to the left common iliac vein region. Both lumens aspirated and flushed well. Catheter was sutured to the skin. Ultrasound demonstrated a patent right common femoral artery. Ultrasound image was saved for documentation. Small skin incision was made. 21 gauge needle was directed into the right common femoral artery with ultrasound guidance. Wire was advanced and micropuncture dilator set was placed. 5 [place] vascular sheath was placed with a Bentson wire. A C2 catheter was used to cannulate the celiac trunk. C2 catheter was then used to cannulate the SMA. Splenic artery was identified coming off the SMA. Splenic artery was cannulated with a Glidewire and the C2 catheter was advanced into the splenic artery. Dedicated splenic artery arteriography was performed. The splenic artery was embolized with Interlock coils. Two 6 mm x 10 cm coils were placed and one 6 mm x 20 cm coil was placed. 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 Catheter was removed. Angiogram was performed through the right groin sheath. Right groin sheath was removed using an ExoSeal closure device for hemostasis.  Bandage placed over the puncture site. FINDINGS: Left femoral central venous catheter tip in the left common iliac vein region. Celiac trunk: Variant anatomy with a common hepatic artery that supplies the GDA and the left hepatic artery. SMA: Variant anatomy with the splenic artery coming off the SMA trunk. There also appears to be a replaced right hepatic artery coming off the SMA. Main SMA is patent. Splenic vein, SMV and portal vein are patent. Splenic artery: There is  a large defect in the peripheral aspect of the mid spleen compatible with a large laceration. No active contrast extravasation identified. Splenic vein was patent. The main trunk of the splenic artery was embolized with 3 coils. Following the coil embolization, there was no flow within the main trunk of the splenic artery but there was delayed filling of the distal splenic artery related to collateral flow through pancreatic branches. IMPRESSION: 1. Successful coil embolization of the main splenic artery for treatment of the splenic laceration. There was no evidence for active splenic bleeding at the time of the angiogram. Main splenic artery embolization was performed in order to decrease the pulse pressure to the spleen. Residual flow to the spleen through collateral branches following coil embolization of the main splenic artery. 2. Variant visceral artery anatomy. The splenic artery and right hepatic artery originate from the SMA. 3. Placement of a left femoral central venous line. Electronically Signed   By: Richarda Overlie M.D.   On: 03/08/2019 19:47   Ir Angiogram Selective Each Additional Vessel  Result Date: 03/08/2019 INDICATION: Patient found unconscious after fall. Patient was brought to the emergency department and intubated. Imaging workup demonstrated a splenic laceration and evidence of active bleeding. EXAM: Visceral angiography: Celiac trunk, SMA and splenic artery Coil embolization of splenic artery Ultrasound guidance for  vascular access x 2 Placement of central venous catheter MEDICATIONS: Ancef 2 g. The antibiotic was administered within 1 hour of the procedure ANESTHESIA/SEDATION: General anesthesia. CONTRAST:  88 mL Omnipaque 300 FLUOROSCOPY TIME:  Fluoroscopy Time: 7 minutes and 48 seconds, 129 mGy COMPLICATIONS: None immediate. PROCEDURE: Emergency consent was obtained for the procedure. A time out was performed prior to the initiation of the procedure. Maximal barrier sterile technique utilized including caps, mask, sterile gowns, sterile gloves, large sterile drape, hand hygiene, and Betadine prep. Both groins were prepped and draped in sterile fashion. Anesthesia requested a central line placement. Ultrasound confirmed a patent left common femoral vein. Image was saved for documentation. Needle was directed in the left common femoral vein with ultrasound guidance. Wire was advanced centrally. The tract was dilated to accommodate a dual lumen central venous catheter. Catheter was advanced to the left common iliac vein region. Both lumens aspirated and flushed well. Catheter was sutured to the skin. Ultrasound demonstrated a patent right common femoral artery. Ultrasound image was saved for documentation. Small skin incision was made. 21 gauge needle was directed into the right common femoral artery with ultrasound guidance. Wire was advanced and micropuncture dilator set was placed. 5 [place] vascular sheath was placed with a Bentson wire. A C2 catheter was used to cannulate the celiac trunk. C2 catheter was then used to cannulate the SMA. Splenic artery was identified coming off the SMA. Splenic artery was cannulated with a Glidewire and the C2 catheter was advanced into the splenic artery. Dedicated splenic artery arteriography was performed. The splenic artery was embolized with Interlock coils. Two 6 mm x 10 cm coils were placed and one 6 mm x 20 cm coil was placed. Follow-up angiography confirmed occlusion of the main  splenic artery. Catheter was removed. Angiogram was performed through the right groin sheath. Right groin sheath was removed using an ExoSeal closure device for hemostasis. Bandage placed over the puncture site. FINDINGS: Left femoral central venous catheter tip in the left common iliac vein region. Celiac trunk: Variant anatomy with a common hepatic artery that supplies the GDA and the left hepatic artery. SMA: Variant anatomy with the splenic artery coming  off the SMA trunk. There also appears to be a replaced right hepatic artery coming off the SMA. Main SMA is patent. Splenic vein, SMV and portal vein are patent. Splenic artery: There is a large defect in the peripheral aspect of the mid spleen compatible with a large laceration. No active contrast extravasation identified. Splenic vein was patent. The main trunk of the splenic artery was embolized with 3 coils. Following the coil embolization, there was no flow within the main trunk of the splenic artery but there was delayed filling of the distal splenic artery related to collateral flow through pancreatic branches. IMPRESSION: 1. Successful coil embolization of the main splenic artery for treatment of the splenic laceration. There was no evidence for active splenic bleeding at the time of the angiogram. Main splenic artery embolization was performed in order to decrease the pulse pressure to the spleen. Residual flow to the spleen through collateral branches following coil embolization of the main splenic artery. 2. Variant visceral artery anatomy. The splenic artery and right hepatic artery originate from the SMA. 3. Placement of a left femoral central venous line. Electronically Signed   By: Richarda Overlie M.D.   On: 03/08/2019 19:47   Dg Pelvis Portable  Result Date: 03/08/2019 CLINICAL DATA:  Level 1 trauma EXAM: PORTABLE PELVIS 1-2 VIEWS COMPARISON:  None. FINDINGS: There is no evidence of pelvic fracture or diastasis. No pelvic bone lesions are seen.  IMPRESSION: Negative. Electronically Signed   By: Marnee Spring M.D.   On: 03/08/2019 11:47   Ir Fluoro Guide Cv Line Left  Result Date: 03/08/2019 INDICATION: Patient found unconscious after fall. Patient was brought to the emergency department and intubated. Imaging workup demonstrated a splenic laceration and evidence of active bleeding. EXAM: Visceral angiography: Celiac trunk, SMA and splenic artery Coil embolization of splenic artery Ultrasound guidance for vascular access x 2 Placement of central venous catheter MEDICATIONS: Ancef 2 g. The antibiotic was administered within 1 hour of the procedure ANESTHESIA/SEDATION: General anesthesia. CONTRAST:  88 mL Omnipaque 300 FLUOROSCOPY TIME:  Fluoroscopy Time: 7 minutes and 48 seconds, 129 mGy COMPLICATIONS: None immediate. PROCEDURE: Emergency consent was obtained for the procedure. A time out was performed prior to the initiation of the procedure. Maximal barrier sterile technique utilized including caps, mask, sterile gowns, sterile gloves, large sterile drape, hand hygiene, and Betadine prep. Both groins were prepped and draped in sterile fashion. Anesthesia requested a central line placement. Ultrasound confirmed a patent left common femoral vein. Image was saved for documentation. Needle was directed in the left common femoral vein with ultrasound guidance. Wire was advanced centrally. The tract was dilated to accommodate a dual lumen central venous catheter. Catheter was advanced to the left common iliac vein region. Both lumens aspirated and flushed well. Catheter was sutured to the skin. Ultrasound demonstrated a patent right common femoral artery. Ultrasound image was saved for documentation. Small skin incision was made. 21 gauge needle was directed into the right common femoral artery with ultrasound guidance. Wire was advanced and micropuncture dilator set was placed. 5 [place] vascular sheath was placed with a Bentson wire. A C2 catheter was  used to cannulate the celiac trunk. C2 catheter was then used to cannulate the SMA. Splenic artery was identified coming off the SMA. Splenic artery was cannulated with a Glidewire and the C2 catheter was advanced into the splenic artery. Dedicated splenic artery arteriography was performed. The splenic artery was embolized with Interlock coils. Two 6 mm x 10 cm coils were  placed and one 6 mm x 20 cm coil was placed. 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 Catheter was removed. Angiogram was performed through the right groin sheath. Right groin sheath was removed using an ExoSeal closure device for hemostasis. Bandage placed over the puncture site. FINDINGS: Left femoral central venous catheter tip in the left common iliac vein region. Celiac trunk: Variant anatomy with a common hepatic artery that supplies the GDA and the left hepatic artery. SMA: Variant anatomy with the splenic artery coming off the SMA trunk. There also appears to be a replaced right hepatic artery coming off the SMA. Main SMA is patent. Splenic vein, SMV and portal vein are patent. Splenic artery: There is a large defect in the peripheral aspect of the mid spleen compatible with a large laceration. No active contrast extravasation identified. Splenic vein was patent. The main trunk of the splenic artery was embolized with 3 coils. Following the coil embolization, there was no flow within the main trunk of the splenic artery but there was delayed filling of the distal splenic artery related to collateral flow through pancreatic branches. IMPRESSION: 1. Successful coil embolization of the main splenic artery for treatment of the splenic laceration. There was no evidence for active splenic bleeding at the time of the angiogram. Main splenic artery embolization was performed in order to decrease the pulse pressure to the spleen. Residual flow to the spleen through collateral branches following coil embolization of the  main splenic artery. 2. Variant visceral artery anatomy. The splenic artery and right hepatic artery originate from the SMA. 3. Placement of a left femoral central venous line. Electronically Signed   By: Richarda Overlie M.D.   On: 03/08/2019 19:47   Ir US Guide Vasc Access Left  Result Date: 03/08/2019 INDICATION: Patient found unconscious after fall. Patient was brought to the emergency department and intubated. Imaging workup demonstrated a splenic laceration and evidence of active bleeding. EXAM: Visceral angiography: Celiac trunk, SMA and splenic artery Coil embolization of splenic artery Ultrasound guidance for vascular access x 2 Placement of central venous catheter MEDICATIONS: Ancef 2 g. The antibiotic was administered within 1 hour of the procedure ANESTHESIA/SEDATION: General anesthesia. CONTRAST:  88 mL Omnipaque 300 FLUOROSCOPY TIME:  Fluoroscopy Time: 7 minutes and 48 seconds, 129 mGy COMPLICATIONS: None immediate. PROCEDURE: Emergency consent was obtained for the procedure. A time out was performed prior to the initiation of the procedure. Maximal barrier sterile technique utilized including caps, mask, sterile gowns, sterile gloves, large sterile drape, hand hygiene, and Betadine prep. Both groins were prepped and draped in sterile fashion. Anesthesia requested a central line placement. Ultrasound confirmed a patent left common femoral vein. Image was saved for documentation. Needle was directed in the left common femoral vein with ultrasound guidance. Wire was advanced centrally. The tract was dilated to accommodate a dual lumen central venous catheter. Catheter was advanced to the left common iliac vein region. Both lumens aspirated and flushed well. Catheter was sutured to the skin. Ultrasound demonstrated a patent right common femoral artery. Ultrasound image was saved for documentation. Small skin incision was made. 21 gauge needle was directed into the right common femoral artery with  ultrasound guidance. Wire was advanced and micropuncture dilator set was placed. 5 [place] vascular sheath was placed with a Bentson wire. A C2 catheter was used to cannulate the celiac trunk. C2 catheter was then used to cannulate the SMA. Splenic artery was identified coming off the SMA. Splenic artery was cannulated with  a Glidewire and the C2 catheter was advanced into the splenic artery. Dedicated splenic artery arteriography was performed. The splenic artery was embolized with Interlock coils. Two 6 mm x 10 cm coils were placed and one 6 mm x 20 cm coil was placed. 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 Catheter was removed. Angiogram was performed through the right groin sheath. Right groin sheath was removed using an ExoSeal closure device for hemostasis. Bandage placed over the puncture site. FINDINGS: Left femoral central venous catheter tip in the left common iliac vein region. Celiac trunk: Variant anatomy with a common hepatic artery that supplies the GDA and the left hepatic artery. SMA: Variant anatomy with the splenic artery coming off the SMA trunk. There also appears to be a replaced right hepatic artery coming off the SMA. Main SMA is patent. Splenic vein, SMV and portal vein are patent. Splenic artery: There is a large defect in the peripheral aspect of the mid spleen compatible with a large laceration. No active contrast extravasation identified. Splenic vein was patent. The main trunk of the splenic artery was embolized with 3 coils. Following the coil embolization, there was no flow within the main trunk of the splenic artery but there was delayed filling of the distal splenic artery related to collateral flow through pancreatic branches. IMPRESSION: 1. Successful coil embolization of the main splenic artery for treatment of the splenic laceration. There was no evidence for active splenic bleeding at the time of the angiogram. Main splenic artery embolization was  performed in order to decrease the pulse pressure to the spleen. Residual flow to the spleen through collateral branches following coil embolization of the main splenic artery. 2. Variant visceral artery anatomy. The splenic artery and right hepatic artery originate from the SMA. 3. Placement of a left femoral central venous line. Electronically Signed   By: Richarda Overlie M.D.   On: 03/08/2019 19:47   Ir US Guide Vasc Access Right  Result Date: 03/08/2019 INDICATION: Patient found unconscious after fall. Patient was brought to the emergency department and intubated. Imaging workup demonstrated a splenic laceration and evidence of active bleeding. EXAM: Visceral angiography: Celiac trunk, SMA and splenic artery Coil embolization of splenic artery Ultrasound guidance for vascular access x 2 Placement of central venous catheter MEDICATIONS: Ancef 2 g. The antibiotic was administered within 1 hour of the procedure ANESTHESIA/SEDATION: General anesthesia. CONTRAST:  88 mL Omnipaque 300 FLUOROSCOPY TIME:  Fluoroscopy Time: 7 minutes and 48 seconds, 129 mGy COMPLICATIONS: None immediate. PROCEDURE: Emergency consent was obtained for the procedure. A time out was performed prior to the initiation of the procedure. Maximal barrier sterile technique utilized including caps, mask, sterile gowns, sterile gloves, large sterile drape, hand hygiene, and Betadine prep. Both groins were prepped and draped in sterile fashion. Anesthesia requested a central line placement. Ultrasound confirmed a patent left common femoral vein. Image was saved for documentation. Needle was directed in the left common femoral vein with ultrasound guidance. Wire was advanced centrally. The tract was dilated to accommodate a dual lumen central venous catheter. Catheter was advanced to the left common iliac vein region. Both lumens aspirated and flushed well. Catheter was sutured to the skin. Ultrasound demonstrated a patent right common femoral artery.  Ultrasound image was saved for documentation. Small skin incision was made. 21 gauge needle was directed into the right common femoral artery with ultrasound guidance. Wire was advanced and micropuncture dilator set was placed. 5 [place] vascular sheath was placed with a  Bentson wire. A C2 catheter was used to cannulate the celiac trunk. C2 catheter was then used to cannulate the SMA. Splenic artery was identified coming off the SMA. Splenic artery was cannulated with a Glidewire and the C2 catheter was advanced into the splenic artery. Dedicated splenic artery arteriography was performed. The splenic artery was embolized with Interlock coils. Two 6 mm x 10 cm coils were placed and one 6 mm x 20 cm coil was placed. 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 Catheter was removed. Angiogram was performed through the right groin sheath. Right groin sheath was removed using an ExoSeal closure device for hemostasis. Bandage placed over the puncture site. FINDINGS: Left femoral central venous catheter tip in the left common iliac vein region. Celiac trunk: Variant anatomy with a common hepatic artery that supplies the GDA and the left hepatic artery. SMA: Variant anatomy with the splenic artery coming off the SMA trunk. There also appears to be a replaced right hepatic artery coming off the SMA. Main SMA is patent. Splenic vein, SMV and portal vein are patent. Splenic artery: There is a large defect in the peripheral aspect of the mid spleen compatible with a large laceration. No active contrast extravasation identified. Splenic vein was patent. The main trunk of the splenic artery was embolized with 3 coils. Following the coil embolization, there was no flow within the main trunk of the splenic artery but there was delayed filling of the distal splenic artery related to collateral flow through pancreatic branches. IMPRESSION: 1. Successful coil embolization of the main splenic artery for  treatment of the splenic laceration. There was no evidence for active splenic bleeding at the time of the angiogram. Main splenic artery embolization was performed in order to decrease the pulse pressure to the spleen. Residual flow to the spleen through collateral branches following coil embolization of the main splenic artery. 2. Variant visceral artery anatomy. The splenic artery and right hepatic artery originate from the SMA. 3. Placement of a left femoral central venous line. Electronically Signed   By: Richarda Overlie M.D.   On: 03/08/2019 19:47   Dg Chest Port 1 View  Result Date: 03/09/2019 CLINICAL DATA:  Left rib fracture after fall. EXAM: PORTABLE CHEST 1 VIEW COMPARISON:  March 08, 2019. FINDINGS: The heart size and mediastinal contours are within normal limits. Endotracheal and nasogastric tubes are in grossly good position. No pneumothorax is noted. Right lung is clear. Left perihilar and basilar opacity is noted concerning for possible pneumonia or contusion. The visualized skeletal structures are unremarkable. IMPRESSION: Endotracheal and nasogastric tubes are in grossly good position. Left perihilar and basilar opacity is noted concerning for possible pneumonia or contusion. Electronically Signed   By: Lupita Raider M.D.   On: 03/09/2019 08:34   Dg Chest Port 1 View  Result Date: 03/08/2019 CLINICAL DATA:  Male patient with trauma. EXAM: PORTABLE CHEST 1 VIEW COMPARISON:  None. FINDINGS: Endotracheal tube with tip approximately 12 mm above the carina. Recommend retraction by 2-3 cm for optimal positioning. Enteric tube with side-port in the distal esophagus and tip in the proximal stomach. Recommend advancing the tube by additional 10 cm. Minimal interstitial densities in the left lung may represent mild contusion. An atypical infection is not excluded. The right lung is clear. There is no pleural effusion or pneumothorax. The cardiac silhouette is within normal limits. No acute osseous  pathology. IMPRESSION: 1. Endotracheal tube with tip above the carina. Recommend retraction by 2-3 cm for  optimal positioning. 2. Enteric tube with side-port in the distal esophagus and tip in the proximal stomach. Recommend further advancing by additional 10 cm. 3. Minimal left lung densities may represent contusion. Electronically Signed   By: Elgie Collard M.D.   On: 03/08/2019 11:49   Ir Embo Art  Peter [facility] Hem[facility] Lymph [employer] Guide Roadmapping  Result Date: 03/08/2019 INDICATION: Patient found unconscious after fall. Patient was brought to the emergency department and intubated. Imaging workup demonstrated a splenic laceration and evidence of active bleeding. EXAM: Visceral angiography: Celiac trunk, SMA and splenic artery Coil embolization of splenic artery Ultrasound guidance for vascular access x 2 Placement of central venous catheter MEDICATIONS: Ancef 2 g. The antibiotic was administered within 1 hour of the procedure ANESTHESIA/SEDATION: General anesthesia. CONTRAST:  88 mL Omnipaque 300 FLUOROSCOPY TIME:  Fluoroscopy Time: 7 minutes and 48 seconds, 129 mGy COMPLICATIONS: None immediate. PROCEDURE: Emergency consent was obtained for the procedure. A time out was performed prior to the initiation of the procedure. Maximal barrier sterile technique utilized including caps, mask, sterile gowns, sterile gloves, large sterile drape, hand hygiene, and Betadine prep. Both groins were prepped and draped in sterile fashion. Anesthesia requested a central line placement. Ultrasound confirmed a patent left common femoral vein. Image was saved for documentation. Needle was directed in the left common femoral vein with ultrasound guidance. Wire was advanced centrally. The tract was dilated to accommodate a dual lumen central venous catheter. Catheter was advanced to the left common iliac vein region. Both lumens aspirated and flushed well. Catheter was sutured to the skin. Ultrasound demonstrated a patent right  common femoral artery. Ultrasound image was saved for documentation. Small skin incision was made. 21 gauge needle was directed into the right common femoral artery with ultrasound guidance. Wire was advanced and micropuncture dilator set was placed. 5 [place] vascular sheath was placed with a Bentson wire. A C2 catheter was used to cannulate the celiac trunk. C2 catheter was then used to cannulate the SMA. Splenic artery was identified coming off the SMA. Splenic artery was cannulated with a Glidewire and the C2 catheter was advanced into the splenic artery. Dedicated splenic artery arteriography was performed. The splenic artery was embolized with Interlock coils. Two 6 mm x 10 cm coils were placed and one 6 mm x 20 cm coil was placed. 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 Catheter was removed. Angiogram was performed through the right groin sheath. Right groin sheath was removed using an ExoSeal closure device for hemostasis. Bandage placed over the puncture site. FINDINGS: Left femoral central venous catheter tip in the left common iliac vein region. Celiac trunk: Variant anatomy with a common hepatic artery that supplies the GDA and the left hepatic artery. SMA: Variant anatomy with the splenic artery coming off the SMA trunk. There also appears to be a replaced right hepatic artery coming off the SMA. Main SMA is patent. Splenic vein, SMV and portal vein are patent. Splenic artery: There is a large defect in the peripheral aspect of the mid spleen compatible with a large laceration. No active contrast extravasation identified. Splenic vein was patent. The main trunk of the splenic artery was embolized with 3 coils. Following the coil embolization, there was no flow within the main trunk of the splenic artery but there was delayed filling of the distal splenic artery related to collateral flow through pancreatic branches. IMPRESSION: 1. Successful coil embolization of the main  splenic artery for treatment of the splenic  laceration. There was no evidence for active splenic bleeding at the time of the angiogram. Main splenic artery embolization was performed in order to decrease the pulse pressure to the spleen. Residual flow to the spleen through collateral branches following coil embolization of the main splenic artery. 2. Variant visceral artery anatomy. The splenic artery and right hepatic artery originate from the SMA. 3. Placement of a left femoral central venous line. Electronically Signed   By: Adam  Henn M.D. Richarda Overlie  On: 03/08/2019 19:47    Labs:  CBC: Recent Labs    03/08/19 1135  03/08/19 1647 03/08/19 2201 03/09/19 0508 03/09/19 0513  WBC 20.8*  --  12.4* 6.9 6.5  --   HGB 15.5   < > 13.7 11.9* 11.1* 10.5*  HCT 44.2   < > 39.8 33.6* 31.6* 31.0*  PLT 341  --  157 143* 141*  --    < > = values in this interval not displayed.    COAGS: Recent Labs    03/08/19 1135  INR 1.2    BMP: Recent Labs    03/08/19 1135 03/08/19 1137  03/08/19 1517 03/08/19 1530 03/08/19 1618 03/09/19 0508 03/09/19 0513  NA 140 142   < > 143 113* 147* 142 144  K 3.2* 3.2*   < > 4.2 2.7* 3.7 3.4* 3.3*  CL 104 102  --  105  --   --  112*  --   CO2 25  --   --   --   --   --  22  --   GLUCOSE 173* 163*  --  99  --   --  89  --   BUN 8 11  --  9  --   --  8  --   CALCIUM 9.1  --   --   --   --   --  8.4*  --   CREATININE 1.02 1.00  --  0.70  --   --  0.90  --   GFRNONAA 44*  --   --   --   --   --  52*  --   GFRAA 51*  --   --   --   --   --  60*  --    < > = values in this interval not displayed.    LIVER FUNCTION TESTS: Recent Labs    03/08/19 1135  BILITOT 0.9  AST 190*  ALT 115*  ALKPHOS 147*  PROT 7.0  ALBUMIN 4.3    Assessment and Plan:  Trauma - unwitnessed fall with splenic injury.  S/P splenic artery embolization.  Groin Ok.  Remove dressing tomorrow.  Will sign off.  Electronically Signed: Gwynneth MacleodWENDY S Harshini Trent, PA-C 03/09/2019, 9:38  AM    I spent a total of 15 Minutes at the the patient's bedside AND on the patient's hospital floor or unit, greater than 50% of which was counseling/coordinating care for splenic artery embolization.

## 2019-03-09 NOTE — Progress Notes (Signed)
I used interpretive services to call his family member using the patient's phone. We were able to get in touch with his uncle. His uncle says that the patient's name is Allen Allen. They were made aware that he is in the hospital. Someone will be coming up to see him. Thayer Ohm D

## 2019-03-09 NOTE — Progress Notes (Signed)
RT NOTE: RT just notified that patient self-extubated about 10 minutes ago. MD and RN are aware. Vitals are stable and sats are 100% on 2L . RT will continue to monitor.

## 2019-03-09 NOTE — Progress Notes (Signed)
Patient transported to CT and back to 7G01 without complications.

## 2019-03-09 NOTE — Progress Notes (Signed)
Patient currently has 2 PIV sites and no longer needs IV team, RN states she will DC central line

## 2019-03-09 NOTE — Progress Notes (Signed)
Subjective: The patient is sedated with propofol and fentanyl.  Objective: Vital signs in last 24 hours: Temp:  [95.2 F (35.1 C)-99.7 F (37.6 C)] 99.7 F (37.6 C) (11/20 0700) Pulse Rate:  [74-132] 86 (11/20 0700) Resp:  [16-27] 16 (11/20 0700) BP: (88-151)/(54-114) 116/69 (11/20 0700) SpO2:  [97 %-100 %] 100 % (11/20 0700) FiO2 (%):  [30 %-40 %] 30 % (11/20 0425) Weight:  [61.9 kg-74.8 kg] 61.9 kg (11/19 1638) Estimated body mass index is 19.58 kg/m as calculated from the following:   Height as of this encounter: 5\' 10"  (1.778 m).   Weight as of this encounter: 61.9 kg.   Intake/Output from previous day: 11/19 0701 - 11/20 0700 In: 6345.4 [I.V.:5149.4; Blood:646; IV Piggyback:550] Out: 1175 [Urine:1175] Intake/Output this shift: No intake/output data recorded.  Physical exam the patient is intubated and sedated.  He is in no apparent distress.  His pupils are equal.  He localizes to pain bilaterally.  I have reviewed the patient's follow-up head CT performed this morning.  It demonstrates partial resolution of the small left subdural hematoma with minimal mass-effect.  Lab Results: Recent Labs    03/08/19 2201 03/09/19 0508 03/09/19 0513  WBC 6.9 6.5  --   HGB 11.9* 11.1* 10.5*  HCT 33.6* 31.6* 31.0*  PLT 143* 141*  --    BMET Recent Labs    03/08/19 1135  03/08/19 1517  03/09/19 0508 03/09/19 0513  NA 140   < > 143   < > 142 144  K 3.2*   < > 4.2   < > 3.4* 3.3*  CL 104   < > 105  --  112*  --   CO2 25  --   --   --  22  --   GLUCOSE 173*   < > 99  --  89  --   BUN 8   < > 9  --  8  --   CREATININE 1.02   < > 0.70  --  0.90  --   CALCIUM 9.1  --   --   --  8.4*  --    < > = values in this interval not displayed.    Studies/Results: Ct Head Wo Contrast  Result Date: 03/09/2019 CLINICAL DATA:  Follow-up known intracranial hemorrhage EXAM: CT HEAD WITHOUT CONTRAST TECHNIQUE: Contiguous axial images were obtained from the base of the skull through the  vertex without intravenous contrast. COMPARISON:  Yesterday FINDINGS: Brain: Subdural hematoma along the left cerebral convexity has diffused more inferiorly and posteriorly, reaching the posterior interhemispheric fissure. Maximal thickness has likewise decreased to 1 mm compared to 4 mm previously. There is 1-2 mm of midline shift. No new site of hemorrhage, infarct, hydrocephalus, or brain edema. Vascular: Negative Skull: Left posterior scalp swelling.  No calvarial fracture. Sinuses/Orbits: Nasopharyngeal and sinus fluid/mucosal thickening in the setting of intubation. IMPRESSION: Left subdural hematoma has diffused along the cerebral convexity with maximal thickness decreased from 4 to 1 mm and midline shift decreased to 2 mm. Electronically Signed   By: Marnee Spring M.D.   On: 03/09/2019 04:28   Ct Head Wo Contrast  Result Date: 03/08/2019 CLINICAL DATA:  Fall.  Found down. EXAM: CT HEAD WITHOUT CONTRAST CT CERVICAL SPINE WITHOUT CONTRAST TECHNIQUE: Multidetector CT imaging of the head and cervical spine was performed following the standard protocol without intravenous contrast. Multiplanar CT image reconstructions of the cervical spine were also generated. COMPARISON:  None. FINDINGS: CT HEAD FINDINGS Brain:  Small left cerebral convexity acute subdural hematoma measuring up to 4-5 mm in maximal thickness. Minimal mass effect on the adjacent sulci with 3 mm left-to-right midline shift. No herniation. No evidence of acute infarction, hydrocephalus, or mass lesion. Vascular: No hyperdense vessel or unexpected calcification. Skull: Normal. Negative for fracture or focal lesion. Sinuses/Orbits: No acute finding. Other: None. CT CERVICAL SPINE FINDINGS Alignment: Straightening of the normal cervical lordosis. No traumatic malalignment. Skull base and vertebrae: No acute fracture. No primary bone lesion or focal pathologic process. Soft tissues and spinal canal: No prevertebral fluid or swelling. No visible  canal hematoma. Disc levels:  Normal. Upper chest: Please see separate CT chest report from same day. Other: None. IMPRESSION: 1. Small left cerebral convexity acute subdural hematoma measuring up to 4-5 mm in maximal thickness. 3 mm left-to-right midline shift. 2.  No acute cervical spine fracture. Critical Value/emergent results were discussed in person at the time of interpretation on 03/08/2019 at 12:10 pm with provider Violeta Gelinas, who verbally acknowledged these results. Electronically Signed   By: Obie Dredge M.D.   On: 03/08/2019 12:23   Ct Chest W Contrast  Result Date: 03/08/2019 CLINICAL DATA:  Fall.  Found down. EXAM: CT CHEST, ABDOMEN, AND PELVIS WITH CONTRAST TECHNIQUE: Multidetector CT imaging of the chest, abdomen and pelvis was performed following the standard protocol during bolus administration of intravenous contrast. CONTRAST:  OMNIPAQUE IOHEXOL 300 MG/ML  SOLN COMPARISON:  None. FINDINGS: CT CHEST FINDINGS Cardiovascular: No significant vascular findings. Normal heart size. No pericardial effusion. No thoracic aortic aneurysm or dissection. Normal central pulmonary arteries. Mediastinum/Nodes: Residual thymus in the anterior mediastinum. No pneumomediastinum. No enlarged mediastinal, hilar, or axillary lymph nodes. Calcified right hilar lymph nodes. Thyroid gland and esophagus demonstrate no significant findings. Enteric tube within the esophagus. Lungs/Pleura: Endotracheal tube tip 1.1 cm above the carina. Small left hemopneumothorax. Debris and hemorrhage within the left lower lobe bronchi. Partial collapse of the left lower lobe with extensive contusion and small traumatic pneumatocele consistent with laceration. There are a few scattered contusions in the posterior left upper lobe. The right lung is clear. Musculoskeletal: Acute nondisplaced fractures of the left posterolateral sixth, seventh, and eighth ribs. Additional acute nondisplaced fracture of the left posterior  eighth rib. Acute nondisplaced fractures of the left T6-T9 transverse processes. No vertebral body fracture. CT ABDOMEN PELVIS FINDINGS Hepatobiliary: No focal hepatic injury. Trace perihepatic hematoma at the inferior tip of the liver. The gallbladder is unremarkable. No biliary dilatation. Pancreas: Unremarkable. No pancreatic ductal dilatation or surrounding inflammatory changes. Spleen: Extensive splenic lacerations with 8 mm focus of intraparenchymal vascular contrast (series 3, image 51; series 5, image 54) that slightly increases in size on delayed imaging, consistent with active bleeding. Small perisplenic hematoma. Adrenals/Urinary Tract: No adrenal hemorrhage or renal injury identified. Bladder is unremarkable. Stomach/Bowel: Enteric tube in the stomach. The stomach is within normal limits. No bowel wall thickening, distention, or surrounding inflammatory changes. Normal appendix. Vascular/Lymphatic: No significant vascular findings are present. No enlarged abdominal or pelvic lymph nodes. Prominent mesenteric lymph nodes are likely reactive. Reproductive: Prostate is unremarkable. Other: Small amount of hemorrhage in the pelvis. No pneumoperitoneum. Musculoskeletal: Acute mildly displaced fracture of the left iliac wing involving the physis with surrounding hematoma. Acute nondisplaced fracture of the left L3 transverse process. IMPRESSION: Chest: 1. Small left hemopneumothorax. 2. Partial collapse of left lower lobe with extensive contusion and small traumatic pneumatocele consistent with laceration. Debris and hemorrhage within the left lower lobe bronchi. 3.  Few scattered contusions in the posterior left upper lobe. 4. Acute nondisplaced fractures of the left sixth through eighth ribs and left T6-T9 transverse processes. For Abdomen and pelvis: 1. Grade IV splenic injury with 8 mm focus of intraparenchymal vascular contrast that slightly increases in size on delayed imaging, consistent with active  bleeding. Small perisplenic hematoma. 2. Trace perihepatic hematoma at the inferior tip of the liver without definite underlying hepatic injury. 3. Acute mildly displaced fracture of the left iliac wing involving the physis. 4. Acute nondisplaced fracture of the left L3 transverse process. Critical Value/emergent results were discussed in person at the time of interpretation on 03/08/2019 at 12:10 pm with provider Violeta Gelinas, who verbally acknowledged these results. Electronically Signed   By: Obie Dredge M.D.   On: 03/08/2019 12:51   Ct Cervical Spine Wo Contrast  Result Date: 03/08/2019 CLINICAL DATA:  Fall.  Found down. EXAM: CT HEAD WITHOUT CONTRAST CT CERVICAL SPINE WITHOUT CONTRAST TECHNIQUE: Multidetector CT imaging of the head and cervical spine was performed following the standard protocol without intravenous contrast. Multiplanar CT image reconstructions of the cervical spine were also generated. COMPARISON:  None. FINDINGS: CT HEAD FINDINGS Brain: Small left cerebral convexity acute subdural hematoma measuring up to 4-5 mm in maximal thickness. Minimal mass effect on the adjacent sulci with 3 mm left-to-right midline shift. No herniation. No evidence of acute infarction, hydrocephalus, or mass lesion. Vascular: No hyperdense vessel or unexpected calcification. Skull: Normal. Negative for fracture or focal lesion. Sinuses/Orbits: No acute finding. Other: None. CT CERVICAL SPINE FINDINGS Alignment: Straightening of the normal cervical lordosis. No traumatic malalignment. Skull base and vertebrae: No acute fracture. No primary bone lesion or focal pathologic process. Soft tissues and spinal canal: No prevertebral fluid or swelling. No visible canal hematoma. Disc levels:  Normal. Upper chest: Please see separate CT chest report from same day. Other: None. IMPRESSION: 1. Small left cerebral convexity acute subdural hematoma measuring up to 4-5 mm in maximal thickness. 3 mm left-to-right midline  shift. 2.  No acute cervical spine fracture. Critical Value/emergent results were discussed in person at the time of interpretation on 03/08/2019 at 12:10 pm with provider Violeta Gelinas, who verbally acknowledged these results. Electronically Signed   By: Obie Dredge M.D.   On: 03/08/2019 12:23   Ct Abdomen Pelvis W Contrast  Result Date: 03/08/2019 CLINICAL DATA:  Fall.  Found down. EXAM: CT CHEST, ABDOMEN, AND PELVIS WITH CONTRAST TECHNIQUE: Multidetector CT imaging of the chest, abdomen and pelvis was performed following the standard protocol during bolus administration of intravenous contrast. CONTRAST:  OMNIPAQUE IOHEXOL 300 MG/ML  SOLN COMPARISON:  None. FINDINGS: CT CHEST FINDINGS Cardiovascular: No significant vascular findings. Normal heart size. No pericardial effusion. No thoracic aortic aneurysm or dissection. Normal central pulmonary arteries. Mediastinum/Nodes: Residual thymus in the anterior mediastinum. No pneumomediastinum. No enlarged mediastinal, hilar, or axillary lymph nodes. Calcified right hilar lymph nodes. Thyroid gland and esophagus demonstrate no significant findings. Enteric tube within the esophagus. Lungs/Pleura: Endotracheal tube tip 1.1 cm above the carina. Small left hemopneumothorax. Debris and hemorrhage within the left lower lobe bronchi. Partial collapse of the left lower lobe with extensive contusion and small traumatic pneumatocele consistent with laceration. There are a few scattered contusions in the posterior left upper lobe. The right lung is clear. Musculoskeletal: Acute nondisplaced fractures of the left posterolateral sixth, seventh, and eighth ribs. Additional acute nondisplaced fracture of the left posterior eighth rib. Acute nondisplaced fractures of the left T6-T9 transverse processes.  No vertebral body fracture. CT ABDOMEN PELVIS FINDINGS Hepatobiliary: No focal hepatic injury. Trace perihepatic hematoma at the inferior tip of the liver. The  gallbladder is unremarkable. No biliary dilatation. Pancreas: Unremarkable. No pancreatic ductal dilatation or surrounding inflammatory changes. Spleen: Extensive splenic lacerations with 8 mm focus of intraparenchymal vascular contrast (series 3, image 51; series 5, image 54) that slightly increases in size on delayed imaging, consistent with active bleeding. Small perisplenic hematoma. Adrenals/Urinary Tract: No adrenal hemorrhage or renal injury identified. Bladder is unremarkable. Stomach/Bowel: Enteric tube in the stomach. The stomach is within normal limits. No bowel wall thickening, distention, or surrounding inflammatory changes. Normal appendix. Vascular/Lymphatic: No significant vascular findings are present. No enlarged abdominal or pelvic lymph nodes. Prominent mesenteric lymph nodes are likely reactive. Reproductive: Prostate is unremarkable. Other: Small amount of hemorrhage in the pelvis. No pneumoperitoneum. Musculoskeletal: Acute mildly displaced fracture of the left iliac wing involving the physis with surrounding hematoma. Acute nondisplaced fracture of the left L3 transverse process. IMPRESSION: Chest: 1. Small left hemopneumothorax. 2. Partial collapse of left lower lobe with extensive contusion and small traumatic pneumatocele consistent with laceration. Debris and hemorrhage within the left lower lobe bronchi. 3. Few scattered contusions in the posterior left upper lobe. 4. Acute nondisplaced fractures of the left sixth through eighth ribs and left T6-T9 transverse processes. For Abdomen and pelvis: 1. Grade IV splenic injury with 8 mm focus of intraparenchymal vascular contrast that slightly increases in size on delayed imaging, consistent with active bleeding. Small perisplenic hematoma. 2. Trace perihepatic hematoma at the inferior tip of the liver without definite underlying hepatic injury. 3. Acute mildly displaced fracture of the left iliac wing involving the physis. 4. Acute  nondisplaced fracture of the left L3 transverse process. Critical Value/emergent results were discussed in person at the time of interpretation on 03/08/2019 at 12:10 pm with provider Violeta Gelinas, who verbally acknowledged these results. Electronically Signed   By: Obie Dredge M.D.   On: 03/08/2019 12:51   Ir Angiogram Visceral Selective  Result Date: 03/09/2019 INDICATION: Patient found unconscious after fall. Patient was brought to the emergency department and intubated. Imaging workup demonstrated a splenic laceration and evidence of active bleeding.  EXAM: Visceral angiography: Celiac trunk, SMA and splenic artery  Coil embolization of splenic artery  Ultrasound guidance for vascular access x 2  Placement of central venous catheter  MEDICATIONS: Ancef 2 g. The antibiotic was administered within 1 hour of the procedure  ANESTHESIA/SEDATION: General anesthesia.  CONTRAST:  88 mL Omnipaque 300  FLUOROSCOPY TIME:  Fluoroscopy Time: 7 minutes and 48 seconds, 129 mGy  COMPLICATIONS: None immediate.  PROCEDURE: Emergency consent was obtained for the procedure. A time out was performed prior to the initiation of the procedure. Maximal barrier sterile technique utilized including caps, mask, sterile gowns, sterile gloves, large sterile drape, hand hygiene, and Betadine prep.  Both groins were prepped and draped in sterile fashion. Anesthesia requested a central line placement. Ultrasound confirmed a patent left common femoral vein. Image was saved for documentation. Needle was directed in the left common femoral vein with ultrasound guidance. Wire was advanced centrally. The tract was dilated to accommodate a dual lumen central venous catheter. Catheter was advanced to the left common iliac vein region. Both lumens aspirated and flushed well. Catheter was sutured to the skin.  Ultrasound demonstrated a patent right common femoral artery. Ultrasound image was saved for documentation. Small skin  incision was made. 21 gauge needle was directed into the right common  femoral artery with ultrasound guidance. Wire was advanced and micropuncture dilator set was placed. 5 [place] vascular sheath was placed with a Bentson wire. A C2 catheter was used to cannulate the celiac trunk. C2 catheter was then used to cannulate the SMA. Splenic artery was identified coming off the SMA. Splenic artery was cannulated with a Glidewire and the C2 catheter was advanced into the splenic artery. Dedicated splenic artery arteriography was performed. The splenic artery was embolized with Interlock coils. Two 6 mm x 10 cm coils were placed and one 6 mm x 20 cm coil was placed. 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 Catheter was removed. Angiogram was performed through the right groin sheath. Right groin sheath was removed using an ExoSeal closure device for hemostasis. Bandage placed over the puncture site.  FINDINGS: Left femoral central venous catheter tip in the left common iliac vein region.  Celiac trunk: Variant anatomy with a common hepatic artery that supplies the GDA and the left hepatic artery.  SMA: Variant anatomy with the splenic artery coming off the SMA trunk. There also appears to be a replaced right hepatic artery coming off the SMA. Main SMA is patent. Splenic vein, SMV and portal vein are patent.  Splenic artery: There is a large defect in the peripheral aspect of the mid spleen compatible with a large laceration. No active contrast extravasation identified. Splenic vein was patent. The main trunk of the splenic artery was embolized with 3 coils. Following the coil embolization, there was no flow within the main trunk of the splenic artery but there was delayed filling of the distal splenic artery related to collateral flow through pancreatic branches.  IMPRESSION: 1. Successful coil embolization of the main splenic artery for treatment of the splenic laceration. There was no  evidence for active splenic bleeding at the time of the angiogram. Main splenic artery embolization was performed in order to decrease the pulse pressure to the spleen. Residual flow to the spleen through collateral branches following coil embolization of the main splenic artery. 2. Variant visceral artery anatomy. The splenic artery and right hepatic artery originate from the SMA. 3. Placement of a left femoral central venous line.   Electronically Signed   By: Richarda Overlie M.D.   On: 03/08/2019 19:47   Ir Angiogram Visceral Selective  Result Date: 03/08/2019 INDICATION: Patient found unconscious after fall. Patient was brought to the emergency department and intubated. Imaging workup demonstrated a splenic laceration and evidence of active bleeding. EXAM: Visceral angiography: Celiac trunk, SMA and splenic artery Coil embolization of splenic artery Ultrasound guidance for vascular access x 2 Placement of central venous catheter MEDICATIONS: Ancef 2 g. The antibiotic was administered within 1 hour of the procedure ANESTHESIA/SEDATION: General anesthesia. CONTRAST:  88 mL Omnipaque 300 FLUOROSCOPY TIME:  Fluoroscopy Time: 7 minutes and 48 seconds, 129 mGy COMPLICATIONS: None immediate. PROCEDURE: Emergency consent was obtained for the procedure. A time out was performed prior to the initiation of the procedure. Maximal barrier sterile technique utilized including caps, mask, sterile gowns, sterile gloves, large sterile drape, hand hygiene, and Betadine prep. Both groins were prepped and draped in sterile fashion. Anesthesia requested a central line placement. Ultrasound confirmed a patent left common femoral vein. Image was saved for documentation. Needle was directed in the left common femoral vein with ultrasound guidance. Wire was advanced centrally. The tract was dilated to accommodate a dual lumen central venous catheter. Catheter was advanced to the left common iliac vein region. Both lumens  aspirated  and flushed well. Catheter was sutured to the skin. Ultrasound demonstrated a patent right common femoral artery. Ultrasound image was saved for documentation. Small skin incision was made. 21 gauge needle was directed into the right common femoral artery with ultrasound guidance. Wire was advanced and micropuncture dilator set was placed. 5 [place] vascular sheath was placed with a Bentson wire. A C2 catheter was used to cannulate the celiac trunk. C2 catheter was then used to cannulate the SMA. Splenic artery was identified coming off the SMA. Splenic artery was cannulated with a Glidewire and the C2 catheter was advanced into the splenic artery. Dedicated splenic artery arteriography was performed. The splenic artery was embolized with Interlock coils. Two 6 mm x 10 cm coils were placed and one 6 mm x 20 cm coil was placed. 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 Catheter was removed. Angiogram was performed through the right groin sheath. Right groin sheath was removed using an ExoSeal closure device for hemostasis. Bandage placed over the puncture site. FINDINGS: Left femoral central venous catheter tip in the left common iliac vein region. Celiac trunk: Variant anatomy with a common hepatic artery that supplies the GDA and the left hepatic artery. SMA: Variant anatomy with the splenic artery coming off the SMA trunk. There also appears to be a replaced right hepatic artery coming off the SMA. Main SMA is patent. Splenic vein, SMV and portal vein are patent. Splenic artery: There is a large defect in the peripheral aspect of the mid spleen compatible with a large laceration. No active contrast extravasation identified. Splenic vein was patent. The main trunk of the splenic artery was embolized with 3 coils. Following the coil embolization, there was no flow within the main trunk of the splenic artery but there was delayed filling of the distal splenic artery related to collateral  flow through pancreatic branches. IMPRESSION: 1. Successful coil embolization of the main splenic artery for treatment of the splenic laceration. There was no evidence for active splenic bleeding at the time of the angiogram. Main splenic artery embolization was performed in order to decrease the pulse pressure to the spleen. Residual flow to the spleen through collateral branches following coil embolization of the main splenic artery. 2. Variant visceral artery anatomy. The splenic artery and right hepatic artery originate from the SMA. 3. Placement of a left femoral central venous line. Electronically Signed   By: Richarda Overlie M.D.   On: 03/08/2019 19:47   Ir Angiogram Selective Each Additional Vessel  Result Date: 03/08/2019 INDICATION: Patient found unconscious after fall. Patient was brought to the emergency department and intubated. Imaging workup demonstrated a splenic laceration and evidence of active bleeding. EXAM: Visceral angiography: Celiac trunk, SMA and splenic artery Coil embolization of splenic artery Ultrasound guidance for vascular access x 2 Placement of central venous catheter MEDICATIONS: Ancef 2 g. The antibiotic was administered within 1 hour of the procedure ANESTHESIA/SEDATION: General anesthesia. CONTRAST:  88 mL Omnipaque 300 FLUOROSCOPY TIME:  Fluoroscopy Time: 7 minutes and 48 seconds, 129 mGy COMPLICATIONS: None immediate. PROCEDURE: Emergency consent was obtained for the procedure. A time out was performed prior to the initiation of the procedure. Maximal barrier sterile technique utilized including caps, mask, sterile gowns, sterile gloves, large sterile drape, hand hygiene, and Betadine prep. Both groins were prepped and draped in sterile fashion. Anesthesia requested a central line placement. Ultrasound confirmed a patent left common femoral vein. Image was saved for documentation. Needle was directed in the left  common femoral vein with ultrasound guidance. Wire was advanced  centrally. The tract was dilated to accommodate a dual lumen central venous catheter. Catheter was advanced to the left common iliac vein region. Both lumens aspirated and flushed well. Catheter was sutured to the skin. Ultrasound demonstrated a patent right common femoral artery. Ultrasound image was saved for documentation. Small skin incision was made. 21 gauge needle was directed into the right common femoral artery with ultrasound guidance. Wire was advanced and micropuncture dilator set was placed. 5 [place] vascular sheath was placed with a Bentson wire. A C2 catheter was used to cannulate the celiac trunk. C2 catheter was then used to cannulate the SMA. Splenic artery was identified coming off the SMA. Splenic artery was cannulated with a Glidewire and the C2 catheter was advanced into the splenic artery. Dedicated splenic artery arteriography was performed. The splenic artery was embolized with Interlock coils. Two 6 mm x 10 cm coils were placed and one 6 mm x 20 cm coil was placed. 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 Catheter was removed. Angiogram was performed through the right groin sheath. Right groin sheath was removed using an ExoSeal closure device for hemostasis. Bandage placed over the puncture site. FINDINGS: Left femoral central venous catheter tip in the left common iliac vein region. Celiac trunk: Variant anatomy with a common hepatic artery that supplies the GDA and the left hepatic artery. SMA: Variant anatomy with the splenic artery coming off the SMA trunk. There also appears to be a replaced right hepatic artery coming off the SMA. Main SMA is patent. Splenic vein, SMV and portal vein are patent. Splenic artery: There is a large defect in the peripheral aspect of the mid spleen compatible with a large laceration. No active contrast extravasation identified. Splenic vein was patent. The main trunk of the splenic artery was embolized with 3 coils. Following  the coil embolization, there was no flow within the main trunk of the splenic artery but there was delayed filling of the distal splenic artery related to collateral flow through pancreatic branches. IMPRESSION: 1. Successful coil embolization of the main splenic artery for treatment of the splenic laceration. There was no evidence for active splenic bleeding at the time of the angiogram. Main splenic artery embolization was performed in order to decrease the pulse pressure to the spleen. Residual flow to the spleen through collateral branches following coil embolization of the main splenic artery. 2. Variant visceral artery anatomy. The splenic artery and right hepatic artery originate from the SMA. 3. Placement of a left femoral central venous line. Electronically Signed   By: Richarda Overlie M.D.   On: 03/08/2019 19:47   Dg Pelvis Portable  Result Date: 03/08/2019 CLINICAL DATA:  Level 1 trauma EXAM: PORTABLE PELVIS 1-2 VIEWS COMPARISON:  None. FINDINGS: There is no evidence of pelvic fracture or diastasis. No pelvic bone lesions are seen. IMPRESSION: Negative. Electronically Signed   By: Marnee Spring M.D.   On: 03/08/2019 11:47   Ir Fluoro Guide Cv Line Left  Result Date: 03/08/2019 INDICATION: Patient found unconscious after fall. Patient was brought to the emergency department and intubated. Imaging workup demonstrated a splenic laceration and evidence of active bleeding. EXAM: Visceral angiography: Celiac trunk, SMA and splenic artery Coil embolization of splenic artery Ultrasound guidance for vascular access x 2 Placement of central venous catheter MEDICATIONS: Ancef 2 g. The antibiotic was administered within 1 hour of the procedure ANESTHESIA/SEDATION: General anesthesia. CONTRAST:  88 mL Omnipaque 300  FLUOROSCOPY TIME:  Fluoroscopy Time: 7 minutes and 48 seconds, 024 mGy COMPLICATIONS: None immediate. PROCEDURE: Emergency consent was obtained for the procedure. A time out was performed prior to  the initiation of the procedure. Maximal barrier sterile technique utilized including caps, mask, sterile gowns, sterile gloves, large sterile drape, hand hygiene, and Betadine prep. Both groins were prepped and draped in sterile fashion. Anesthesia requested a central line placement. Ultrasound confirmed a patent left common femoral vein. Image was saved for documentation. Needle was directed in the left common femoral vein with ultrasound guidance. Wire was advanced centrally. The tract was dilated to accommodate a dual lumen central venous catheter. Catheter was advanced to the left common iliac vein region. Both lumens aspirated and flushed well. Catheter was sutured to the skin. Ultrasound demonstrated a patent right common femoral artery. Ultrasound image was saved for documentation. Small skin incision was made. 21 gauge needle was directed into the right common femoral artery with ultrasound guidance. Wire was advanced and micropuncture dilator set was placed. 5 [place] vascular sheath was placed with a Bentson wire. A C2 catheter was used to cannulate the celiac trunk. C2 catheter was then used to cannulate the SMA. Splenic artery was identified coming off the SMA. Splenic artery was cannulated with a Glidewire and the C2 catheter was advanced into the splenic artery. Dedicated splenic artery arteriography was performed. The splenic artery was embolized with Interlock coils. Two 6 mm x 10 cm coils were placed and one 6 mm x 20 cm coil was placed. 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 Catheter was removed. Angiogram was performed through the right groin sheath. Right groin sheath was removed using an ExoSeal closure device for hemostasis. Bandage placed over the puncture site. FINDINGS: Left femoral central venous catheter tip in the left common iliac vein region. Celiac trunk: Variant anatomy with a common hepatic artery that supplies the GDA and the left hepatic artery. SMA:  Variant anatomy with the splenic artery coming off the SMA trunk. There also appears to be a replaced right hepatic artery coming off the SMA. Main SMA is patent. Splenic vein, SMV and portal vein are patent. Splenic artery: There is a large defect in the peripheral aspect of the mid spleen compatible with a large laceration. No active contrast extravasation identified. Splenic vein was patent. The main trunk of the splenic artery was embolized with 3 coils. Following the coil embolization, there was no flow within the main trunk of the splenic artery but there was delayed filling of the distal splenic artery related to collateral flow through pancreatic branches. IMPRESSION: 1. Successful coil embolization of the main splenic artery for treatment of the splenic laceration. There was no evidence for active splenic bleeding at the time of the angiogram. Main splenic artery embolization was performed in order to decrease the pulse pressure to the spleen. Residual flow to the spleen through collateral branches following coil embolization of the main splenic artery. 2. Variant visceral artery anatomy. The splenic artery and right hepatic artery originate from the SMA. 3. Placement of a left femoral central venous line. Electronically Signed   By: Markus Daft M.D.   On: 03/08/2019 19:47   Ir US Guide Vasc Access Left  Result Date: 03/08/2019 INDICATION: Patient found unconscious after fall. Patient was brought to the emergency department and intubated. Imaging workup demonstrated a splenic laceration and evidence of active bleeding. EXAM: Visceral angiography: Celiac trunk, SMA and splenic artery Coil embolization of splenic artery Ultrasound  guidance for vascular access x 2 Placement of central venous catheter MEDICATIONS: Ancef 2 g. The antibiotic was administered within 1 hour of the procedure ANESTHESIA/SEDATION: General anesthesia. CONTRAST:  88 mL Omnipaque 300 FLUOROSCOPY TIME:  Fluoroscopy Time: 7 minutes and  48 seconds, 129 mGy COMPLICATIONS: None immediate. PROCEDURE: Emergency consent was obtained for the procedure. A time out was performed prior to the initiation of the procedure. Maximal barrier sterile technique utilized including caps, mask, sterile gowns, sterile gloves, large sterile drape, hand hygiene, and Betadine prep. Both groins were prepped and draped in sterile fashion. Anesthesia requested a central line placement. Ultrasound confirmed a patent left common femoral vein. Image was saved for documentation. Needle was directed in the left common femoral vein with ultrasound guidance. Wire was advanced centrally. The tract was dilated to accommodate a dual lumen central venous catheter. Catheter was advanced to the left common iliac vein region. Both lumens aspirated and flushed well. Catheter was sutured to the skin. Ultrasound demonstrated a patent right common femoral artery. Ultrasound image was saved for documentation. Small skin incision was made. 21 gauge needle was directed into the right common femoral artery with ultrasound guidance. Wire was advanced and micropuncture dilator set was placed. 5 [place] vascular sheath was placed with a Bentson wire. A C2 catheter was used to cannulate the celiac trunk. C2 catheter was then used to cannulate the SMA. Splenic artery was identified coming off the SMA. Splenic artery was cannulated with a Glidewire and the C2 catheter was advanced into the splenic artery. Dedicated splenic artery arteriography was performed. The splenic artery was embolized with Interlock coils. Two 6 mm x 10 cm coils were placed and one 6 mm x 20 cm coil was placed. 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 Catheter was removed. Angiogram was performed through the right groin sheath. Right groin sheath was removed using an ExoSeal closure device for hemostasis. Bandage placed over the puncture site. FINDINGS: Left femoral central venous catheter tip in the  left common iliac vein region. Celiac trunk: Variant anatomy with a common hepatic artery that supplies the GDA and the left hepatic artery. SMA: Variant anatomy with the splenic artery coming off the SMA trunk. There also appears to be a replaced right hepatic artery coming off the SMA. Main SMA is patent. Splenic vein, SMV and portal vein are patent. Splenic artery: There is a large defect in the peripheral aspect of the mid spleen compatible with a large laceration. No active contrast extravasation identified. Splenic vein was patent. The main trunk of the splenic artery was embolized with 3 coils. Following the coil embolization, there was no flow within the main trunk of the splenic artery but there was delayed filling of the distal splenic artery related to collateral flow through pancreatic branches. IMPRESSION: 1. Successful coil embolization of the main splenic artery for treatment of the splenic laceration. There was no evidence for active splenic bleeding at the time of the angiogram. Main splenic artery embolization was performed in order to decrease the pulse pressure to the spleen. Residual flow to the spleen through collateral branches following coil embolization of the main splenic artery. 2. Variant visceral artery anatomy. The splenic artery and right hepatic artery originate from the SMA. 3. Placement of a left femoral central venous line. Electronically Signed   By: Richarda Overlie M.D.   On: 03/08/2019 19:47   Ir US Guide Vasc Access Right  Result Date: 03/08/2019 INDICATION: Patient found unconscious after fall. Patient  was brought to the emergency department and intubated. Imaging workup demonstrated a splenic laceration and evidence of active bleeding. EXAM: Visceral angiography: Celiac trunk, SMA and splenic artery Coil embolization of splenic artery Ultrasound guidance for vascular access x 2 Placement of central venous catheter MEDICATIONS: Ancef 2 g. The antibiotic was administered within  1 hour of the procedure ANESTHESIA/SEDATION: General anesthesia. CONTRAST:  88 mL Omnipaque 300 FLUOROSCOPY TIME:  Fluoroscopy Time: 7 minutes and 48 seconds, 129 mGy COMPLICATIONS: None immediate. PROCEDURE: Emergency consent was obtained for the procedure. A time out was performed prior to the initiation of the procedure. Maximal barrier sterile technique utilized including caps, mask, sterile gowns, sterile gloves, large sterile drape, hand hygiene, and Betadine prep. Both groins were prepped and draped in sterile fashion. Anesthesia requested a central line placement. Ultrasound confirmed a patent left common femoral vein. Image was saved for documentation. Needle was directed in the left common femoral vein with ultrasound guidance. Wire was advanced centrally. The tract was dilated to accommodate a dual lumen central venous catheter. Catheter was advanced to the left common iliac vein region. Both lumens aspirated and flushed well. Catheter was sutured to the skin. Ultrasound demonstrated a patent right common femoral artery. Ultrasound image was saved for documentation. Small skin incision was made. 21 gauge needle was directed into the right common femoral artery with ultrasound guidance. Wire was advanced and micropuncture dilator set was placed. 5 [place] vascular sheath was placed with a Bentson wire. A C2 catheter was used to cannulate the celiac trunk. C2 catheter was then used to cannulate the SMA. Splenic artery was identified coming off the SMA. Splenic artery was cannulated with a Glidewire and the C2 catheter was advanced into the splenic artery. Dedicated splenic artery arteriography was performed. The splenic artery was embolized with Interlock coils. Two 6 mm x 10 cm coils were placed and one 6 mm x 20 cm coil was placed. 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 Catheter was removed. Angiogram was performed through the right groin sheath. Right groin sheath was  removed using an ExoSeal closure device for hemostasis. Bandage placed over the puncture site. FINDINGS: Left femoral central venous catheter tip in the left common iliac vein region. Celiac trunk: Variant anatomy with a common hepatic artery that supplies the GDA and the left hepatic artery. SMA: Variant anatomy with the splenic artery coming off the SMA trunk. There also appears to be a replaced right hepatic artery coming off the SMA. Main SMA is patent. Splenic vein, SMV and portal vein are patent. Splenic artery: There is a large defect in the peripheral aspect of the mid spleen compatible with a large laceration. No active contrast extravasation identified. Splenic vein was patent. The main trunk of the splenic artery was embolized with 3 coils. Following the coil embolization, there was no flow within the main trunk of the splenic artery but there was delayed filling of the distal splenic artery related to collateral flow through pancreatic branches. IMPRESSION: 1. Successful coil embolization of the main splenic artery for treatment of the splenic laceration. There was no evidence for active splenic bleeding at the time of the angiogram. Main splenic artery embolization was performed in order to decrease the pulse pressure to the spleen. Residual flow to the spleen through collateral branches following coil embolization of the main splenic artery. 2. Variant visceral artery anatomy. The splenic artery and right hepatic artery originate from the SMA. 3. Placement of a left femoral central venous  line. Electronically Signed   By: Richarda Overlie M.D.   On: 03/08/2019 19:47   Dg Chest Port 1 View  Result Date: 03/08/2019 CLINICAL DATA:  Male patient with trauma. EXAM: PORTABLE CHEST 1 VIEW COMPARISON:  None. FINDINGS: Endotracheal tube with tip approximately 12 mm above the carina. Recommend retraction by 2-3 cm for optimal positioning. Enteric tube with side-port in the distal esophagus and tip in the proximal  stomach. Recommend advancing the tube by additional 10 cm. Minimal interstitial densities in the left lung may represent mild contusion. An atypical infection is not excluded. The right lung is clear. There is no pleural effusion or pneumothorax. The cardiac silhouette is within normal limits. No acute osseous pathology. IMPRESSION: 1. Endotracheal tube with tip above the carina. Recommend retraction by 2-3 cm for optimal positioning. 2. Enteric tube with side-port in the distal esophagus and tip in the proximal stomach. Recommend further advancing by additional 10 cm. 3. Minimal left lung densities may represent contusion. Electronically Signed   By: Elgie Collard M.D.   On: 03/08/2019 11:49   Ir Embo Art  Peter [facility] Hemorr Lymph [employer] Guide Roadmapping  Result Date: 03/08/2019 INDICATION: Patient found unconscious after fall. Patient was brought to the emergency department and intubated. Imaging workup demonstrated a splenic laceration and evidence of active bleeding. EXAM: Visceral angiography: Celiac trunk, SMA and splenic artery Coil embolization of splenic artery Ultrasound guidance for vascular access x 2 Placement of central venous catheter MEDICATIONS: Ancef 2 g. The antibiotic was administered within 1 hour of the procedure ANESTHESIA/SEDATION: General anesthesia. CONTRAST:  88 mL Omnipaque 300 FLUOROSCOPY TIME:  Fluoroscopy Time: 7 minutes and 48 seconds, 129 mGy COMPLICATIONS: None immediate. PROCEDURE: Emergency consent was obtained for the procedure. A time out was performed prior to the initiation of the procedure. Maximal barrier sterile technique utilized including caps, mask, sterile gowns, sterile gloves, large sterile drape, hand hygiene, and Betadine prep. Both groins were prepped and draped in sterile fashion. Anesthesia requested a central line placement. Ultrasound confirmed a patent left common femoral vein. Image was saved for documentation. Needle was directed in the left common  femoral vein with ultrasound guidance. Wire was advanced centrally. The tract was dilated to accommodate a dual lumen central venous catheter. Catheter was advanced to the left common iliac vein region. Both lumens aspirated and flushed well. Catheter was sutured to the skin. Ultrasound demonstrated a patent right common femoral artery. Ultrasound image was saved for documentation. Small skin incision was made. 21 gauge needle was directed into the right common femoral artery with ultrasound guidance. Wire was advanced and micropuncture dilator set was placed. 5 [place] vascular sheath was placed with a Bentson wire. A C2 catheter was used to cannulate the celiac trunk. C2 catheter was then used to cannulate the SMA. Splenic artery was identified coming off the SMA. Splenic artery was cannulated with a Glidewire and the C2 catheter was advanced into the splenic artery. Dedicated splenic artery arteriography was performed. The splenic artery was embolized with Interlock coils. Two 6 mm x 10 cm coils were placed and one 6 mm x 20 cm coil was placed. 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 Catheter was removed. Angiogram was performed through the right groin sheath. Right groin sheath was removed using an ExoSeal closure device for hemostasis. Bandage placed over the puncture site. FINDINGS: Left femoral central venous catheter tip in the left common iliac vein region. Celiac trunk: Variant anatomy with a common hepatic  artery that supplies the GDA and the left hepatic artery. SMA: Variant anatomy with the splenic artery coming off the SMA trunk. There also appears to be a replaced right hepatic artery coming off the SMA. Main SMA is patent. Splenic vein, SMV and portal vein are patent. Splenic artery: There is a large defect in the peripheral aspect of the mid spleen compatible with a large laceration. No active contrast extravasation identified. Splenic vein was patent. The main trunk of  the splenic artery was embolized with 3 coils. Following the coil embolization, there was no flow within the main trunk of the splenic artery but there was delayed filling of the distal splenic artery related to collateral flow through pancreatic branches. IMPRESSION: 1. Successful coil embolization of the main splenic artery for treatment of the splenic laceration. There was no evidence for active splenic bleeding at the time of the angiogram. Main splenic artery embolization was performed in order to decrease the pulse pressure to the spleen. Residual flow to the spleen through collateral branches following coil embolization of the main splenic artery. 2. Variant visceral artery anatomy. The splenic artery and right hepatic artery originate from the SMA. 3. Placement of a left femoral central venous line. Electronically Signed   By: Richarda OverlieAdam  Henn M.D.   On: 03/08/2019 19:47    Assessment/Plan: Traumatic brain injury, subdural hematoma: The patient is stable from my point of view and can be extubated when trauma feels its appropriate.  LOS: 1 day     Cristi LoronJeffrey D Estefanny Moler 03/09/2019, 7:30 AM

## 2019-03-10 LAB — GLUCOSE, CAPILLARY: Glucose-Capillary: 104 mg/dL — ABNORMAL HIGH (ref 70–99)

## 2019-03-10 MED ORDER — DOCUSATE SODIUM 50 MG/5ML PO LIQD
100.0000 mg | Freq: Two times a day (BID) | ORAL | Status: DC
  Administered 2019-03-10 – 2019-03-12 (×5): 100 mg via ORAL
  Filled 2019-03-10 (×4): qty 10

## 2019-03-10 NOTE — Progress Notes (Signed)
OT Cancellation Note  Patient Details Name: Allen Allen MRN: [id number] DOB: 2001/03/10   Cancelled Treatment:    Reason Eval/Treat Not Completed: Active bedrest order. Will return as schedule allows.  [place], OTR/L Acute Rehab Pager: [phone] Office: [phone] 03/10/2019, 7:05 AM

## 2019-03-10 NOTE — Progress Notes (Signed)
While working with PT, patient stated he had bilateral foot numbness and lower back pain. Reported to MD. Will continue to monitor.

## 2019-03-10 NOTE — Progress Notes (Addendum)
Trauma Critical Care Follow Up Note  Subjective:    Overnight Issues: NAEON, extubated yest  Objective:  Vital signs for last 24 hours: Temp:  [98 F (36.7 C)-100.4 F (38 C)] 98.4 F (36.9 C) (11/21 0800) Pulse Rate:  [85-121] 110 (11/21 0900) Resp:  [19-33] 24 (11/21 0900) BP: (113-138)/(72-91) 113/84 (11/21 0800) SpO2:  [89 %-100 %] 92 % (11/21 0900)  Hemodynamic parameters for last 24 hours:    Intake/Output from previous day: 11/20 0701 - 11/21 0700 In: 2422.1 [I.V.:2422.1] Out: 2150 [Urine:2150]  Intake/Output this shift: Total I/O In: 200.1 [I.V.:200.1] Out: -   Vent settings for last 24 hours:    Physical Exam:  Gen: no distress Neuro: minimally interactive, but non-focal exam Neck: c-collar in place CV: RRR Pulm: unlabored breathing Abd: soft, nontender GU: clear, yellow urine Extr: wwp, no edema   Results for orders placed or performed during the hospital encounter of 03/08/19 (from the past 24 hour(s))  BLOOD TRANSFUSION REPORT - SCANNED     Status: None   Collection Time: 03/09/19 10:18 AM   Narrative   Ordered by an unspecified provider.  Glucose, capillary     Status: Abnormal   Collection Time: 03/09/19 12:04 PM  Result Value Ref Range   Glucose-Capillary 122 (H) 70 - 99 mg/dL  CBC     Status: Abnormal   Collection Time: 03/09/19 12:38 PM  Result Value Ref Range   WBC 9.8 4.0 - 10.5 K/uL   RBC 3.90 (L) 4.22 - 5.81 MIL/uL   Hemoglobin 11.7 (L) 13.0 - 17.0 g/dL   HCT 16.1 (L) 09.6 - 04.5 %   MCV 86.9 80.0 - 100.0 fL   MCH 30.0 26.0 - 34.0 pg   MCHC 34.5 30.0 - 36.0 g/dL   RDW 40.9 81.1 - 91.4 %   Platelets 142 (L) 150 - 400 K/uL   nRBC 0.0 0.0 - 0.2 %  CBC     Status: Abnormal   Collection Time: 03/09/19  4:09 PM  Result Value Ref Range   WBC 10.0 4.0 - 10.5 K/uL   RBC 4.08 (L) 4.22 - 5.81 MIL/uL   Hemoglobin 12.3 (L) 13.0 - 17.0 g/dL   HCT 78.2 (L) 95.6 - 21.3 %   MCV 87.0 80.0 - 100.0 fL   MCH 30.1 26.0 - 34.0 pg   MCHC 34.6  30.0 - 36.0 g/dL   RDW 08.6 57.8 - 46.9 %   Platelets 145 (L) 150 - 400 K/uL   nRBC 0.0 0.0 - 0.2 %  Glucose, capillary     Status: Abnormal   Collection Time: 03/09/19  4:19 PM  Result Value Ref Range   Glucose-Capillary 148 (H) 70 - 99 mg/dL  Glucose, capillary     Status: Abnormal   Collection Time: 03/09/19  7:18 PM  Result Value Ref Range   Glucose-Capillary 118 (H) 70 - 99 mg/dL  Glucose, capillary     Status: Abnormal   Collection Time: 03/09/19 11:23 PM  Result Value Ref Range   Glucose-Capillary 129 (H) 70 - 99 mg/dL  Glucose, capillary     Status: Abnormal   Collection Time: 03/10/19  7:41 AM  Result Value Ref Range   Glucose-Capillary 104 (H) 70 - 99 mg/dL   Comment 1 Notify RN    Comment 2 Document in Chart     Assessment & Plan: Present on Admission: . Spleen laceration extending into parenchyma with open wound into cavity    LOS: 2 days  Additional comments:I reviewed the patient's new clinical lab test results.   and I reviewed the patients new imaging test results.    36M s/p fall from 25+ft  G3 splenic lac with parasplenic hematoma and active extrav - s/p AE 11/19 Left iliac wing fracture with surrounding hematoma - Ortho consulted, non-op mgmt Left SDH - NSGY on board, repeat head CT stable 11/20 Left Pulm Contusion/small PTX/HPTX - pulm toilet Left Rib Fx 6, 7, 8 - Multimodal pain control  T7, T8, T9 and L3 TP fxs - Multimodal pain control  VDRF - extubated 11/20 FEN - reg diet, d/c MIVF VTE - SCDs, LMWH Dispo - 4NP, PT/OT  Ability to interact and answer questions impairs ability to clear c-collar at this time. Will re-assess.    Jesusita Oka, MD Trauma & General Surgery Please use [email or web address] to contact on call provider  03/10/2019  *Care during the described time interval was provided by me. I have reviewed this patient's available data, including medical history, events of note, physical examination and test results as part of my  evaluation.

## 2019-03-10 NOTE — Progress Notes (Signed)
Patient ID: Allen Allen, male   DOB: 10-04-2000, 18 y.o.   MRN: [id number] BP 135/88   Pulse (!) 108   Temp 98.1 F (36.7 C) (Oral)   Resp (!) 27   Ht 5\' 10"  (1.778 m)   Wt 61.9 kg   SpO2 97%   BMI 19.58 kg/m  Alert and oriented x 4, complaining of pain in the left shoulder perrl Symmetric facies Tongue and uvula midline Doing well

## 2019-03-10 NOTE — Evaluation (Signed)
Physical Therapy Evaluation Patient Details Name: Allen Allen MRN: [id number] DOB: Apr 12, 2001 Today's Date: 03/10/2019   History of Present Illness    18 y.o. male with no reported past medical hx who was brought in as a level 1 to [facility] after he was found down after a suspected fall. Per EMS, patient was at a construction site at the [place]. He was suspected to have a fall from 25+ft through a skylight. + LOC. This was unwitnessed. Awoke and became combative. CXR and Pelvis xray unremarkable. CT seft subdural hematoma has diffused along the cerebral convexity with maximal thickness decreased from 4 to 1 mm and midline shift decreased to 2 mm. Further injuries include G3 splenic lac with parasplenic hematoma and active extrav- s/p AE 11/19. Left Pulm Contusion/small PTX/HPTX, Left Rib Fx 6, 7, 8.  T7, T8, T9 and L3 TP fxs.  Clinical Impression  Patient seen for PT evaluation. At this time, patient demonstrates deficits in functional mobility, cognition, attention, and balance requiring hands on assist min to moderate assist throughout. Patient also endorsing bilateral LE deficits (distal sensation and weakness). At this time, feel patient will required continued comprehensive therapies to address deficits and maximize functional recovery. Recommend CIR consult at this time. OF NOTE: Translation during co-treatment provided by B. Deblois SLP.    Follow Up Recommendations CIR(pending progress (may progress quickly))    Equipment Recommendations  (TBD)    Recommendations for Other Services       Precautions / Restrictions Precautions Precautions: Fall Required Braces or Orthoses: Cervical Brace Cervical Brace: Hard collar Restrictions Weight Bearing Restrictions: Yes RLE Weight Bearing: Weight bearing as tolerated LLE Weight Bearing: Weight bearing as tolerated      Mobility  Bed Mobility Overal bed mobility: Needs Assistance Bed Mobility: Supine to Sit;Sit to Supine      Supine to sit: Min assist Sit to supine: Min assist   General bed mobility comments: Min assist for safety   Transfers Overall transfer level: Needs assistance Equipment used: 1 person hand held assist Transfers: Sit to/from Stand Sit to Stand: Min assist         General transfer comment: Min assist for safety and stability  Ambulation/Gait Ambulation/Gait assistance: Min assist;Mod assist Gait Distance (Feet): 60 Feet Assistive device: 1 person hand held assist Gait Pattern/deviations: Step-through pattern;Decreased stride length Gait velocity: decreased Gait velocity interpretation: <1.31 ft/sec, indicative of household ambulator General Gait Details: noted instability with poor coordination of gait. Posterior list.  Stairs            Wheelchair Mobility    Modified Rankin (Stroke Patients Only)       Balance Overall balance assessment: Needs assistance Sitting-balance support: Feet supported Sitting balance-Leahy Scale: Good Sitting balance - Comments: able to perform dynamic tasks in sitting without assist     Standing balance-Leahy Scale: Poor Standing balance comment: posterior list required hands on assist             High level balance activites: Backward walking;Turns;Direction changes High Level Balance Comments: hands on assist throughout             Pertinent Vitals/Pain Pain Assessment: (When mobilizing) Faces Pain Scale: Hurts even more Pain Location: back, neck and generalized Pain Descriptors / Indicators: Discomfort;Grimacing;Guarding Pain Intervention(s): Monitored during session;Repositioned    Home Living Family/patient expects to be discharged to:: Private residence Living Arrangements: Other relatives Available Help at Discharge: Family Type of Home: House Home Access: Level entry  Additional Comments: poor historian unsure  of accuracy states lives with cousin and her baby    Prior Function Level of  Independence: Independent               Hand Dominance   Dominant Hand: Right    Extremity/Trunk Assessment   Upper Extremity Assessment Upper Extremity Assessment: Overall WFL for tasks assessed    Lower Extremity Assessment Lower Extremity Assessment: RLE deficits/detail;LLE deficits/detail RLE Sensation: decreased light touch RLE Coordination: decreased fine motor;decreased gross motor LLE Sensation: decreased light touch LLE Coordination: decreased fine motor;decreased gross motor    Cervical / Trunk Assessment Cervical / Trunk Assessment: (in C collar at this time, pain in neck)  Communication   Communication: Prefers language other than English  Cognition Arousal/Alertness: Awake/alert   Overall Cognitive Status: Impaired/Different from baseline Area of Impairment: Orientation;Attention;Memory                 Orientation Level: Disoriented to;Place;Situation Current Attention Level: Selective Memory: Decreased short-term memory                General Comments      Exercises     Assessment/Plan    PT Assessment Patient needs continued PT services  PT Problem List Decreased strength;Decreased activity tolerance;Decreased balance;Decreased mobility;Decreased coordination;Decreased knowledge of precautions;Impaired sensation;Pain       PT Treatment Interventions DME instruction;Gait training;Stair training;Functional mobility training;Therapeutic activities;Therapeutic exercise;Balance training;Neuromuscular re-education;Cognitive remediation;Patient/family education    PT Goals (Current goals can be found in the Care Plan section)  Acute Rehab PT Goals Patient Stated Goal: to go home with coursin PT Goal Formulation: With patient Time For Goal Achievement: 03/24/19 Potential to Achieve Goals: Good    Frequency Min 5X/week   Barriers to discharge        Co-evaluation PT/OT/SLP Co-Evaluation/Treatment: Yes Reason for Co-Treatment:  Complexity of the patient's impairments (multi-system involvement);Necessary to address cognition/behavior during functional activity;For patient/therapist safety PT goals addressed during session: Mobility/safety with mobility;Balance   SLP goals addressed during session: Cognition;Communication     AM-PAC PT "6 Clicks" Mobility  Outcome Measure Help needed turning from your back to your side while in a flat bed without using bedrails?: A Little Help needed moving from lying on your back to sitting on the side of a flat bed without using bedrails?: A Little Help needed moving to and from a bed to a chair (including a wheelchair)?: A Little Help needed standing up from a chair using your arms (e.g., wheelchair or bedside chair)?: A Lot Help needed to walk in hospital room?: A Lot Help needed climbing 3-5 steps with a railing? : A Lot 6 Click Score: 15    End of Session Equipment Utilized During Treatment: Gait belt;Oxygen Activity Tolerance: Patient limited by fatigue;Patient limited by pain Patient left: in bed;with call bell/phone within reach;with bed alarm set Nurse Communication: Mobility status;Precautions PT Visit Diagnosis: Unsteadiness on feet (R26.81);Muscle weakness (generalized) (M62.81);Difficulty in walking, not elsewhere classified (R26.2)    Time: 7253-6644 PT Time Calculation (min) (ACUTE ONLY): 27 min   Charges:   PT Evaluation $PT Eval Moderate Complexity: 1 Mod          Charlotte Crumb, PT DPT  Board Certified Neurologic Specialist Acute Rehabilitation Services Office [phone]   Fabio Asa 03/10/2019, 10:10 AM

## 2019-03-10 NOTE — Evaluation (Addendum)
Speech Language Pathology Evaluation Patient Details Name: Dajuan Turnley MRN: [id number] DOB: [birth year] Today's Date: 03/10/2019 Time: 4196-2229 SLP Time Calculation (min) (ACUTE ONLY): 43 min  Problem List:  Patient Active Problem List   Diagnosis Date Noted  . Spleen laceration extending into parenchyma with open wound into cavity 03/08/2019   Past Medical History: History reviewed. No pertinent past medical history. Past Surgical History:  Past Surgical History:  Procedure Laterality Date  . IR ANGIOGRAM SELECTIVE EACH ADDITIONAL VESSEL  03/08/2019  . IR ANGIOGRAM VISCERAL SELECTIVE  03/08/2019  . IR ANGIOGRAM VISCERAL SELECTIVE  03/08/2019  . IR EMBO ART  VEN HEMORR LYMPH EXTRAV  INC GUIDE ROADMAPPING  03/08/2019  . IR FLUORO GUIDE CV LINE LEFT  03/08/2019  . IR US GUIDE VASC ACCESS LEFT  03/08/2019  . IR US GUIDE VASC ACCESS RIGHT  03/08/2019  . RADIOLOGY WITH ANESTHESIA N/A 03/08/2019   Procedure: IR FOR EMBOLIZATION;  Surgeon: Radiologist, Medication, MD;  Location: [place];  Service: Radiology;  Laterality: N/A;   HPI:  18 y.o. male with no reported past medical hx who was brought in as a level 1 to [facility] after he was found down after a suspected fall. Per EMS, patient was at a construction site at the [place]. He was suspected to have a fall from 25+ft through a skylight. + LOC. This was unwitnessed. Awoke and became combative. CXR and Pelvis xray unremarkable. CT seft subdural hematoma has diffused along the cerebral convexity with maximal thickness decreased from 4 to 1 mm and midline shift decreased to 2 mm. Further injuries include G3 splenic lac with parasplenic hematoma and active extrav- s/p AE 11/19. Left Pulm Contusion/small PTX/HPTX, Left Rib Fx 6, 7, 8.  T7, T8, T9 and L3 TP fxs.    Assessment / Plan / Recommendation Clinical Impression  Pt demonstrates impaired cognition consistent with mild TBI With behavior consistent with a Rancho IV (Confused, appropriate).  He is alert and able to sustain attention to task, can follow two but not three step commands. Primary areas of deficit are short term memory (though this is rapidly improving with frequent cueing), and basic verbal and functional reasoning and problem solving. Pt was able to verbalize basic needs  such a toileting. Needs question cues to comprehend basic tasks such as hand washing, and more novel but intuitive tasks such as use of call bell. By the end of session with min cues he was oriented to place, time and situation, and was able to verbalize reasoning for use and demonstrate use of tv remote and call bell with moderate assist. Recommend considerate for CIR at d/c. Will continue to facilitate cognitive recovery.     SLP Assessment  SLP Recommendation/Assessment: Patient needs continued Speech Lanaguage Pathology Services SLP Visit Diagnosis: Cognitive communication deficit (R41.841)    Follow Up Recommendations  Inpatient Rehab    Frequency and Duration min 2x/week  2 weeks      SLP Evaluation Cognition  Overall Cognitive Status: Impaired/Different from baseline Arousal/Alertness: Awake/alert Orientation Level: Oriented to person;Disoriented to place;Disoriented to time;Disoriented to situation Attention: Focused;Sustained;Selective Focused Attention: Appears intact Sustained Attention: Appears intact Selective Attention: Appears intact Memory: Impaired Memory Impairment: Decreased short term memory;Storage deficit Decreased Short Term Memory: Verbal basic Awareness: Impaired Awareness Impairment: Intellectual impairment;Emergent impairment Problem Solving: Impaired Problem Solving Impairment: Verbal basic;Functional basic Executive Function: Reasoning;Initiating;Sequencing Reasoning: Impaired Reasoning Impairment: Verbal basic Sequencing: Impaired Sequencing Impairment: Functional basic Initiating: Impaired Initiating Impairment: Functional basic;Verbal  basic Safety/Judgment: Impaired  Comprehension  Auditory Comprehension Overall Auditory Comprehension: Appears within functional limits for tasks assessed    Expression Verbal Expression Overall Verbal Expression: Appears within functional limits for tasks assessed Written Expression Dominant Hand: Right   Oral / Motor  Oral Motor/Sensory Function Overall Oral Motor/Sensory Function: Within functional limits Motor Speech Overall Motor Speech: Appears within functional limits for tasks assessed   GO                   Harlon Ditty, MA CCC-SLP  Acute Rehabilitation Services Pager [phone] Office [phone]  Claudine Mouton 03/10/2019, 10:03 AM

## 2019-03-10 NOTE — Progress Notes (Addendum)
Collar cleared clinically via interpreter

## 2019-03-10 NOTE — Progress Notes (Signed)
Late entry, Eval completed by Darliss Cheney, SLP on 03/09/19   03/09/19 1449  SLP Visit Information  SLP Received On 03/09/19  General Information  HPI 18 y.o. male with no reported past medical hx who was brought in as a level 1 to [facility] after he was found down after a suspected fall. Per EMS, patient was at a construction site at the [place]. He was suspected to have a fall from 25+ft through a skylight. + LOC. This was unwitnessed. Awoke and became combative. CXR and Pelvis xray unremarkable. CT seft subdural hematoma has diffused along the cerebral convexity with maximal thickness decreased from 4 to 1 mm and midline shift decreased to 2 mm.  Type of Study Bedside Swallow Evaluation  Previous Swallow Assessment  (none)  Diet Prior to this Study Thin liquids (clears)  Temperature Spikes Noted No  Respiratory Status Room air  History of Recent Intubation Yes  Length of Intubations (days)  (< 1 day)  Date extubated 03/09/19  Behavior/Cognition Alert;Cooperative;Pleasant mood  Oral Cavity Assessment WFL  Oral Care Completed by SLP No  Oral Cavity - Dentition Adequate natural dentition  Vision Functional for self-feeding  Self-Feeding Abilities Able to feed self  Patient Positioning Upright in bed  Baseline Vocal Quality Normal  Volitional Cough Strong  Volitional Swallow Able to elicit  Pain Assessment  Pain Assessment Faces  Faces Pain Scale 4  Pain Location back  Oral Motor/Sensory Function  Overall Oral Motor/Sensory Function WFL  Ice Chips  Ice chips NT  Thin Liquid  Thin Liquid WFL  Presentation Cup;Straw  Nectar Thick Liquid  Nectar Thick Liquid NT  Honey Thick Liquid  Honey Thick Liquid NT  Puree  Puree WFL  Solid  Solid WFL  SLP - End of Session  Patient left in bed;with call bell/phone within reach  Nurse Communication Diet recommendation  SLP Assessment  Clinical Impression Statement (ACUTE ONLY) Swallow assessment completed through video interpreter following  less than one day intubation. Adequate control and transit across thin and regular textures and no s/s aspiration present with multiple sips. CXR earlier today revealed seft perihilar and basilar opacity is noted concerning for possible pneumonia or contusion. Recommend regular diet texture, thin liquids and no follow up needed for swallow.    SLP Visit Diagnosis Dysphagia, unspecified (R13.10)  Impact on safety and function No limitations  Swallow Evaluation Recommendations  SLP Diet Recommendations Regular;Thin liquid  Liquid Administration via Cup;Straw  Medication Administration Whole meds with liquid  Supervision Patient able to self feed  Postural Changes Seated upright at 90 degrees  Treatment Plan  Oral Care Recommendations Oral care BID  Treatment Recommendations Therapy as outlined in treatment plan below;No treatment recommended at this time  Follow up Recommendations None  Prognosis  Prognosis for Safe Diet Advancement Good  Individuals Consulted  Consulted and Agree with Results and Recommendations Patient;RN  SLP Time Calculation  SLP Start Time (ACUTE ONLY) 1452  SLP Stop Time (ACUTE ONLY) 1508  SLP Time Calculation (min) (ACUTE ONLY) 16 min  SLP Evaluations  $ SLP Speech Visit 1 Visit  SLP Evaluations  $BSS Swallow 1 Procedure

## 2019-03-11 LAB — CBC
HCT: 32.4 % — ABNORMAL LOW (ref 39.0–52.0)
Hemoglobin: 11.3 g/dL — ABNORMAL LOW (ref 13.0–17.0)
MCH: 30.2 pg (ref 26.0–34.0)
MCHC: 34.9 g/dL (ref 30.0–36.0)
MCV: 86.6 fL (ref 80.0–100.0)
Platelets: 139 10*3/uL — ABNORMAL LOW (ref 150–400)
RBC: 3.74 MIL/uL — ABNORMAL LOW (ref 4.22–5.81)
RDW: 14 % (ref 11.5–15.5)
WBC: 9.1 10*3/uL (ref 4.0–10.5)
nRBC: 0 % (ref 0.0–0.2)

## 2019-03-11 LAB — BASIC METABOLIC PANEL
Anion gap: 10 (ref 5–15)
BUN: 8 mg/dL (ref 6–20)
CO2: 23 mmol/L (ref 22–32)
Calcium: 8.7 mg/dL — ABNORMAL LOW (ref 8.9–10.3)
Chloride: 101 mmol/L (ref 98–111)
Creatinine, Ser: 0.67 mg/dL (ref 0.61–1.24)
GFR calc Af Amer: >60 mL/min (ref >60)
GFR calc non Af Amer: >60 mL/min (ref >60)
Glucose, Bld: 99 mg/dL (ref 70–99)
Potassium: 3.3 mmol/L — ABNORMAL LOW (ref 3.5–5.1)
Sodium: 134 mmol/L — ABNORMAL LOW (ref 135–145)

## 2019-03-11 LAB — MAGNESIUM: Magnesium: 2 mg/dL (ref 1.7–2.4)

## 2019-03-11 LAB — PHOSPHORUS: Phosphorus: 4 mg/dL (ref 2.5–4.6)

## 2019-03-11 NOTE — Progress Notes (Signed)
Trauma Critical Care Follow Up Note  Subjective:    Overnight Issues: NAEON, c-collar cleared yest  Objective:  Vital signs for last 24 hours: Temp:  [98.1 F (36.7 C)-99.1 F (37.3 C)] 98.6 F (37 C) (11/22 0438) Pulse Rate:  [98-112] 98 (11/22 0716) Resp:  [22-29] 22 (11/22 0716) BP: (115-135)/(70-90) 115/70 (11/22 0716) SpO2:  [90 %-97 %] 95 % (11/22 0716)  Hemodynamic parameters for last 24 hours:    Intake/Output from previous day: 11/21 0701 - 11/22 0700 In: 330.1 [I.V.:330.1] Out: 1000 [Urine:1000]  Intake/Output this shift: No intake/output data recorded.  Vent settings for last 24 hours:    Physical Exam:  Gen: no distress Neuro: incredibly more interactive today Neck: supple CV: RRR Pulm: unlabored breathing Abd: soft, nontender GU: clear, yellow urine Extr: wwp, no edema   Results for orders placed or performed during the hospital encounter of 03/08/19 (from the past 24 hour(s))  CBC     Status: Abnormal   Collection Time: 03/11/19  5:50 AM  Result Value Ref Range   WBC 9.1 4.0 - 10.5 K/uL   RBC 3.74 (L) 4.22 - 5.81 MIL/uL   Hemoglobin 11.3 (L) 13.0 - 17.0 g/dL   HCT 32.4 (L) 39.0 - 52.0 %   MCV 86.6 80.0 - 100.0 fL   MCH 30.2 26.0 - 34.0 pg   MCHC 34.9 30.0 - 36.0 g/dL   RDW 14.0 11.5 - 15.5 %   Platelets 139 (L) 150 - 400 K/uL   nRBC 0.0 0.0 - 0.2 %  Basic metabolic panel     Status: Abnormal   Collection Time: 03/11/19  5:50 AM  Result Value Ref Range   Sodium 134 (L) 135 - 145 mmol/L   Potassium 3.3 (L) 3.5 - 5.1 mmol/L   Chloride 101 98 - 111 mmol/L   CO2 23 22 - 32 mmol/L   Glucose, Bld 99 70 - 99 mg/dL   BUN 8 6 - 20 mg/dL   Creatinine, Ser 0.67 0.61 - 1.24 mg/dL   Calcium 8.7 (L) 8.9 - 10.3 mg/dL   GFR calc non Af Amer >60 >60 mL/min   GFR calc Af Amer >60 >60 mL/min   Anion gap 10 5 - 15  Magnesium     Status: None   Collection Time: 03/11/19  5:50 AM  Result Value Ref Range   Magnesium 2.0 1.7 - 2.4 mg/dL  Phosphorus      Status: None   Collection Time: 03/11/19  5:50 AM  Result Value Ref Range   Phosphorus 4.0 2.5 - 4.6 mg/dL    Assessment & Plan: Present on Admission: . Spleen laceration extending into parenchyma with open wound into cavity    LOS: 3 days   Additional comments:I reviewed the patient's new clinical lab test results.   and I reviewed the patients new imaging test results.    3M s/p fall from 25+ft  G3 splenic lac with parasplenic hematoma and active extrav - s/p AE 11/19 Left iliac wing fracture with surrounding hematoma - Ortho consulted, non-op mgmt Left SDH - NSGY on board, repeat head CT stable 11/20 Left Pulm Contusion/small PTX/HPTX - pulm toilet Left Rib Fx 6, 7, 8 - Multimodal pain control  T7, T8, T9 and L3 TP fxs - Multimodal pain control  VDRF - extubated 11/20 FEN - reg diet, d/c MIVF VTE - SCDs, LMWH Dispo - 4NP, PT/OT   Jesusita Oka, MD Trauma & General Surgery Please use [email or web address] to contact  on call provider  03/11/2019  *Care during the described time interval was provided by me. I have reviewed this patient's available data, including medical history, events of note, physical examination and test results as part of my evaluation.

## 2019-03-11 NOTE — Progress Notes (Addendum)
Occupational Therapy Evaluation Patient Details Name: Allen Allen MRN: [id number] DOB: 10/08/2000 Today's Date: 03/11/2019    History of Present Illness 18 y.o. male with no reported past medical hx who was brought in as a level 1 to [facility] after he was found down after a suspected fall. Per EMS, patient was at a construction site at the [place]. He was suspected to have a fall from 25+ft through a skylight. + LOC. This was unwitnessed. Awoke and became combative. CXR and Pelvis xray unremarkable. CT seft subdural hematoma has diffused along the cerebral convexity with maximal thickness decreased from 4 to 1 mm and midline shift decreased to 2 mm. Further injuries include G3 splenic lac with parasplenic hematoma and active extrav - s/p AE 11/19. Left Pulm Contusion/small PTX/HPTX, Left Rib Fx 6, 7, 8.  T7, T8, T9 and L3 TP fxs.   Clinical Impression   PTA, pt lived with uncle in one story house; worked as a Tax inspector and was independent for all BADLs and IADLs. Pt currently presents with decreased balance, activity tolerance, awareness, orientation, attention, poor STM, and increased pain. Pt currently requires supervision- min guard A for UB ADLs, Min guard A for LB ADLs, and min guard A- min A for functional mobility. Pt participated in simple path finding task, demonstrated difficulty with STM and attention, requiring max VCs to complete task. Pt also with several episodes of LOB during path finding task and required Min Guard-Min A for correction and fall prevention. Pt would benefit from continued OT acutely to address deficits and facilitate safe dc. Recommend dc to CIR to address cognitive deficits, increase safe performance of ADLs, and decrease burden of care.     Follow Up Recommendations  CIR;Supervision/Assistance - 24 hour    Equipment Recommendations  Other (comment)(defer to next venue)    Recommendations for Other Services PT consult;Rehab consult      Precautions / Restrictions Precautions Precautions: Fall Restrictions Weight Bearing Restrictions: Yes RLE Weight Bearing: Weight bearing as tolerated LLE Weight Bearing: Weight bearing as tolerated      Mobility Bed Mobility Overal bed mobility: Needs Assistance Bed Mobility: Sit to Supine;Supine to Sit     Supine to sit: Supervision Sit to supine: Supervision   General bed mobility comments: supervision for safety  Transfers Overall transfer level: Needs assistance Equipment used: None Transfers: Sit to/from Stand Sit to Stand: Min guard         General transfer comment: Pt required min guard A for balance and safety.     Balance Overall balance assessment: Needs assistance Sitting-balance support: Feet supported;No upper extremity supported Sitting balance-Leahy Scale: Good Sitting balance - Comments: pt donned socks sitting EOB without LOB   Standing balance support: No upper extremity supported;During functional activity Standing balance-Leahy Scale: Fair Standing balance comment: Pt had some episodes of LOB during toileting and simple path finding, requiring min guard A- min A to regain balance                           ADL either performed or assessed with clinical judgement   ADL Overall ADL's : Needs assistance/impaired Eating/Feeding: Modified independent;Sitting   Grooming: Oral care;Wash/dry hands;Min guard;Standing Grooming Details (indicate cue type and reason): Pt required min guard A for oral care and hand hygiene for balance.  Upper Body Bathing: Supervision/ safety;Sitting   Lower Body Bathing: Min guard;Sit to/from stand   Upper Body Dressing : Supervision/safety;Sitting   Lower  Body Dressing: Min guard;Sit to/from stand Lower Body Dressing Details (indicate cue type and reason): Pt reached to floor to adjust socks sitting EOB. min guard A for safety Toilet Transfer: Min guard;Ambulation;Regular Teacher, adult education Details  (indicate cue type and reason): Pt began to walk back towards bed, but abruptly turned around and walked towards toilet. Pt completed toileting with min guard A for safety and balance.  Toileting- Architect and Hygiene: Min guard;Sit to/from stand Toileting - Clothing Manipulation Details (indicate cue type and reason): required min guard A for safety and balance     Functional mobility during ADLs: Min guard;Minimal assistance General ADL Comments: Pt performed UB ADLs with supervision- min guard A, LB ADLs with min guard A, and functional mobility with min guard A- min A. Pt had several episodes of LOB during simple path finding, requiring min A to correct.      Vision   Vision Assessment?: No apparent visual deficits     Perception     Praxis      Pertinent Vitals/Pain Pain Assessment: Faces Faces Pain Scale: Hurts little more Pain Location: Back and L shoulder Pain Descriptors / Indicators: Aching;Discomfort;Grimacing;Sore Pain Intervention(s): Monitored during session;Repositioned     Hand Dominance Right   Extremity/Trunk Assessment Upper Extremity Assessment Upper Extremity Assessment: Overall WFL for tasks assessed   Lower Extremity Assessment Lower Extremity Assessment: Defer to PT evaluation   Cervical / Trunk Assessment Cervical / Trunk Assessment: Normal   Communication Communication Communication: Prefers language other than English   Cognition Arousal/Alertness: Awake/alert Behavior During Therapy: WFL for tasks assessed/performed Overall Cognitive Status: Impaired/Different from baseline Area of Impairment: Orientation;Attention;Memory;Safety/judgement;Awareness;Problem solving               Rancho Levels of Cognitive Functioning Rancho Los Amigos Scales of Cognitive Functioning: Automatic/appropriate Orientation Level: Place;Time;Situation Current Attention Level: Sustained Memory: Decreased short-term memory   Safety/Judgement:  Decreased awareness of safety;Decreased awareness of deficits Awareness: Intellectual Problem Solving: Slow processing;Requires verbal cues General Comments: Pt disoriented, reporting it was september, and could not recall he was in hospital. Pt does not recall the accident. Pt with poor STM as seen by not recalling he is was in [facility] ~3 minutes after being told. Pt participated in simple path finding task, was unable to recall directions to elevator. Pt recalled he had to turn to the right, but attempted to turn to the left. Pt required max VCs to recall and locate elevators and his room. Pt demonstrating decreased awareness by stating it was easy to find the elevators; but with cues did recall that he required a lot of assistance.    General Comments  Interpreter Harriett Sine [phone].    Exercises     Shoulder Instructions      Home Living Family/patient expects to be discharged to:: Private residence Living Arrangements: Other relatives Available Help at Discharge: Family Type of Home: House Home Access: Level entry     Home Layout: One level     Bathroom Shower/Tub: Producer, television/film/video: Standard     Home Equipment: None          Prior Functioning/Environment Level of Independence: Independent        Comments: Pt reports living with his uncle. Reports independence with all BADLs, works as a Copywriter, advertising Problem List: Decreased strength;Decreased activity tolerance;Decreased range of motion;Impaired balance (sitting and/or standing);Decreased cognition;Decreased safety awareness;Decreased knowledge of use of DME or AE;Decreased  knowledge of precautions;Pain      OT Treatment/Interventions: Self-care/ADL training;DME and/or AE instruction;Therapeutic activities;Cognitive remediation/compensation;Patient/family education;Balance training    OT Goals(Current goals can be found in the care plan section) Acute Rehab OT  Goals Patient Stated Goal: go home OT Goal Formulation: With patient Time For Goal Achievement: 03/25/19 Potential to Achieve Goals: Good  OT Frequency: Min 2X/week   Barriers to D/C:            Co-evaluation              AM-PAC OT "6 Clicks" Daily Activity     Outcome Measure Help from another person eating meals?: None Help from another person taking care of personal grooming?: A Little Help from another person toileting, which includes using toliet, bedpan, or urinal?: A Little Help from another person bathing (including washing, rinsing, drying)?: A Little Help from another person to put on and taking off regular upper body clothing?: None Help from another person to put on and taking off regular lower body clothing?: A Little 6 Click Score: 20   End of Session Nurse Communication: Mobility status  Activity Tolerance: Patient tolerated treatment well Patient left: in bed;with call bell/phone within reach  OT Visit Diagnosis: Unsteadiness on feet (R26.81);Other abnormalities of gait and mobility (R26.89);Muscle weakness (generalized) (M62.81);Pain;Other symptoms and signs involving cognitive function Pain - part of body: (back)                Time: [id number] OT Time Calculation (min): 26 min Charges:  OT General Charges $OT Visit: 1 Visit OT Evaluation $OT Eval Moderate Complexity: 1 Mod OT Treatments $Self Care/Home Management : 8-22 mins  Sandrea HammondJoAnna Pawlik, OTS  Jovonte Commins M Gabriela Giannelli Ednamae Schiano MSOT, OTR/L Acute Rehab Pager: [phone] Office: [phone]  Session was completed under the supervision of a licensed therapist and note was read, reviewed, edited and agree with student's findings and recommendations.     03/11/2019, 3:21 PM

## 2019-03-11 NOTE — Progress Notes (Signed)
Patient ID: Allen Allen, male   DOB: 2000/04/20, 18 y.o.   MRN: [id number] BP 115/70 (BP Location: Left Arm)   Pulse 98   Temp 98.6 F (37 C) (Oral)   Resp (!) 22   Ht 5\' 10"  (1.778 m)   Wt 61.9 kg   SpO2 95%   BMI 19.58 kg/m  Alert and oriented x 4 Moving all extremities Improving, no new recommendations

## 2019-03-11 NOTE — Progress Notes (Signed)
Physical Therapy Treatment Patient Details Name: Allen Allen MRN: [id number] DOB: 2000/06/16 Today's Date: 03/11/2019    History of Present Illness 18 y.o. male with no reported past medical hx who was brought in as a level 1 to [facility] after he was found down after a suspected fall. Per EMS, patient was at a construction site at the [place]. He was suspected to have a fall from 25+ft through a skylight. + LOC. This was unwitnessed. Awoke and became combative. CXR and Pelvis xray unremarkable. CT seft subdural hematoma has diffused along the cerebral convexity with maximal thickness decreased from 4 to 1 mm and midline shift decreased to 2 mm. Further injuries include G3 splenic lac with parasplenic hematoma and active extrav - s/p AE 11/19. Left Pulm Contusion/small PTX/HPTX, Left Rib Fx 6, 7, 8.  T7, T8, T9 and L3 TP fxs.    PT Comments    Patient progressing well towards PT goals. Improved ambulation distance with Min A-Min guard assist for balance/safety with pt needing UE support holding onto IV pole. Noted to have unsteadiness. HR up 138 bpm and pt with SBP drop post activity from 122 to 98. Reports some dizziness. Noted to have some cognitive deficits relating to orientation, attention and memory. Will continue to follow and progress as tolerated.    Follow Up Recommendations  CIR(progressing quickly)     Equipment Recommendations  Other (comment)(TBD)    Recommendations for Other Services       Precautions / Restrictions Precautions Precautions: Fall Restrictions Weight Bearing Restrictions: Yes RLE Weight Bearing: Weight bearing as tolerated LLE Weight Bearing: Weight bearing as tolerated    Mobility  Bed Mobility Overal bed mobility: Needs Assistance Bed Mobility: Supine to Sit;Sit to Supine     Supine to sit: Supervision;HOB elevated Sit to supine: Supervision;HOB elevated   General bed mobility comments: No assist needed; needed cues of placing feet on floor  to ultimately get up because pt scooting along bed horizontally with cues to sit up EOB. Difficulty following/understanding commands.  Transfers Overall transfer level: Needs assistance Equipment used: None Transfers: Sit to/from Stand Sit to Stand: Min assist         General transfer comment: Min A to steady in standing, dizziness reported. Stood from [employer].  Ambulation/Gait Ambulation/Gait assistance: Min guard;Min assist Gait Distance (Feet): 150 Feet Assistive device: IV Pole Gait Pattern/deviations: Step-through pattern;Decreased stride length;Drifts right/left Gait velocity: decreased   General Gait Details: Slow, mildly unsteady gait holding onto IV pole for support; drifting noted. Reports soreness in back. No dizziness. HR up to 138 bpm.   Stairs             Wheelchair Mobility    Modified Rankin (Stroke Patients Only)       Balance Overall balance assessment: Needs assistance Sitting-balance support: Feet supported;No upper extremity supported Sitting balance-Leahy Scale: Good Sitting balance - Comments: Able to donn socks without difficulty.   Standing balance support: During functional activity Standing balance-Leahy Scale: Fair Standing balance comment: Able to stand and urinate in bathroom, mild unsteadiness noted.                            Cognition Arousal/Alertness: Awake/alert Behavior During Therapy: WFL for tasks assessed/performed Overall Cognitive Status: Impaired/Different from baseline Area of Impairment: Orientation;Attention;Memory                 Orientation Level: Disoriented to;Time;Situation Current Attention Level: Selective Memory: Decreased  short-term memory         General Comments: Thinks it is September, able to state "20" Does not recall anything from the accident. Increased time and difficulty coutning down by 2s from 20 with a few errors.      Exercises      General Comments General  comments (skin integrity, edema, etc.): Interpreter Quillian Quince [phone]      Pertinent Vitals/Pain Pain Assessment: Faces Faces Pain Scale: Hurts a little bit Pain Location: low back Pain Descriptors / Indicators: Sore;Discomfort Pain Intervention(s): Repositioned;Monitored during session    Home Living                      Prior Function            PT Goals (current goals can now be found in the care plan section) Progress towards PT goals: Progressing toward goals    Frequency    Min 5X/week      PT Plan Current plan remains appropriate    Co-evaluation              AM-PAC PT "6 Clicks" Mobility   Outcome Measure  Help needed turning from your back to your side while in a flat bed without using bedrails?: None Help needed moving from lying on your back to sitting on the side of a flat bed without using bedrails?: A Little Help needed moving to and from a bed to a chair (including a wheelchair)?: A Little Help needed standing up from a chair using your arms (e.g., wheelchair or bedside chair)?: A Little Help needed to walk in hospital room?: A Little Help needed climbing 3-5 steps with a railing? : A Lot 6 Click Score: 18    End of Session Equipment Utilized During Treatment: Gait belt Activity Tolerance: Patient tolerated treatment well Patient left: in bed;with call bell/phone within reach;with bed alarm set Nurse Communication: Mobility status PT Visit Diagnosis: Unsteadiness on feet (R26.81);Muscle weakness (generalized) (M62.81);Difficulty in walking, not elsewhere classified (R26.2);Pain Pain - part of body: (back)     Time: 9892-1194 PT Time Calculation (min) (ACUTE ONLY): 25 min  Charges:  $Gait Training: 8-22 mins $Therapeutic Activity: 8-22 mins                     Marisa Severin, PT, DPT Acute Rehabilitation Services Pager [phone] Office [place] 03/11/2019, 9:44 AM

## 2019-03-11 NOTE — Progress Notes (Addendum)
  Speech Language Pathology Treatment: Cognitive-Linquistic  Patient Details Name: Allen Allen MRN: [id number] DOB: Feb 10, 2001 Today's Date: 03/11/2019 Time: 1045-1100 SLP Time Calculation (min) (ACUTE ONLY): 15 min  Assessment / Plan / Recommendation Clinical Impression  Cognition much improved today, pt initiating in familiar social and functional tasks. Demonstrates basic problem solving in novel tasks with minimal cueing today. Short term memory is improving, but deficits with working memory to complete tasks accurately persists. Function consistent with a post-concussive state and recovery is expected. Discussed needs for quiet rest and appropriate supervision, but pt reports he does not live with family, but with "some guy named [name]" and his uncle lives nearby. Supervision from family is recommended at d/c. Planned to call uncle, but his contact is not in chart so will need to address this next session with pt.    HPI HPI: 18 y.o. male with no reported past medical hx who was brought in as a level 1 to [facility] after he was found down after a suspected fall. Per EMS, patient was at a construction site at the [place]. He was suspected to have a fall from 25+ft through a skylight. + LOC. This was unwitnessed. Awoke and became combative. CXR and Pelvis xray unremarkable. CT seft subdural hematoma has diffused along the cerebral convexity with maximal thickness decreased from 4 to 1 mm and midline shift decreased to 2 mm. Further injuries include G3 splenic lac with parasplenic hematoma and active extrav- s/p AE 11/19. Left Pulm Contusion/small PTX/HPTX, Left Rib Fx 6, 7, 8.  T7, T8, T9 and L3 TP fxs.       SLP Plan  Continue with current plan of care       Recommendations                   Oral Care Recommendations: Oral care BID Follow up Recommendations: CIR SLP Visit Diagnosis: Cognitive communication deficit (O71.219) Plan: Continue with current plan of care        GO               [name], MA CCC-SLP  Acute Rehabilitation Services Pager [phone] Office [phone]  Lynann Beaver 03/11/2019, 11:17 AM

## 2019-03-12 DIAGNOSIS — Z7409 Other reduced mobility: Secondary | ICD-10-CM

## 2019-03-12 DIAGNOSIS — Z789 Other specified health status: Secondary | ICD-10-CM

## 2019-03-12 LAB — BASIC METABOLIC PANEL
Anion gap: 8 (ref 5–15)
BUN: 6 mg/dL (ref 6–20)
CO2: 25 mmol/L (ref 22–32)
Calcium: 8.6 mg/dL — ABNORMAL LOW (ref 8.9–10.3)
Chloride: 101 mmol/L (ref 98–111)
Creatinine, Ser: 0.72 mg/dL (ref 0.61–1.24)
GFR calc Af Amer: >60 mL/min (ref >60)
GFR calc non Af Amer: >60 mL/min (ref >60)
Glucose, Bld: 104 mg/dL — ABNORMAL HIGH (ref 70–99)
Potassium: 3.3 mmol/L — ABNORMAL LOW (ref 3.5–5.1)
Sodium: 134 mmol/L — ABNORMAL LOW (ref 135–145)

## 2019-03-12 LAB — TYPE AND SCREEN
ABO/RH(D): O POS
Antibody Screen: NEGATIVE
Unit division: 0
Unit division: 0
Unit division: 0
Unit division: 0
Unit division: 0

## 2019-03-12 LAB — BPAM RBC
Blood Product Expiration Date: 202012202359
Blood Product Expiration Date: 202012262359
Blood Product Expiration Date: 202012262359
Blood Product Expiration Date: 202012262359
Blood Product Expiration Date: 202012262359
ISSUE DATE / TIME: 202011191216
ISSUE DATE / TIME: 202011191442
ISSUE DATE / TIME: 202011191442
ISSUE DATE / TIME: 202011191442
ISSUE DATE / TIME: 202011191442
Unit Type and Rh: 5100
Unit Type and Rh: 5100
Unit Type and Rh: 5100
Unit Type and Rh: 5100
Unit Type and Rh: 5100

## 2019-03-12 LAB — CBC
HCT: 31.5 % — ABNORMAL LOW (ref 39.0–52.0)
Hemoglobin: 11.2 g/dL — ABNORMAL LOW (ref 13.0–17.0)
MCH: 30.5 pg (ref 26.0–34.0)
MCHC: 35.6 g/dL (ref 30.0–36.0)
MCV: 85.8 fL (ref 80.0–100.0)
Platelets: 155 10*3/uL (ref 150–400)
RBC: 3.67 MIL/uL — ABNORMAL LOW (ref 4.22–5.81)
RDW: 13.6 % (ref 11.5–15.5)
WBC: 8 10*3/uL (ref 4.0–10.5)
nRBC: 0 % (ref 0.0–0.2)

## 2019-03-12 LAB — PHOSPHORUS: Phosphorus: 4.1 mg/dL (ref 2.5–4.6)

## 2019-03-12 LAB — MAGNESIUM: Magnesium: 2 mg/dL (ref 1.7–2.4)

## 2019-03-12 MED ORDER — OXYCODONE HCL 5 MG PO TABS: 5.0000 mg | ORAL_TABLET | Freq: Four times a day (QID) | ORAL | 0 refills | Status: AC | PRN

## 2019-03-12 MED ORDER — DOCUSATE SODIUM 100 MG PO CAPS: 100.0000 mg | ORAL_CAPSULE | Freq: Two times a day (BID) | ORAL | Status: DC

## 2019-03-12 MED ORDER — OXYCODONE HCL 5 MG PO TABS: 5.0000 mg | ORAL_TABLET | ORAL | Status: DC | PRN

## 2019-03-12 MED ORDER — POTASSIUM CHLORIDE 20 MEQ/15ML (10%) PO SOLN
40.0000 meq | Freq: Once | ORAL | Status: AC
  Administered 2019-03-12: 40 meq via ORAL
  Filled 2019-03-12: qty 30

## 2019-03-12 MED ORDER — DOCUSATE SODIUM 100 MG PO CAPS: 100.0000 mg | ORAL_CAPSULE | Freq: Two times a day (BID) | ORAL | 0 refills | Status: AC

## 2019-03-12 NOTE — TOC Transition Note (Signed)
Transition of Care [facility]) - CM/SW Discharge Note   Patient Details  Name: Allen Allen MRN: [id number] Date of Birth: 08/25/2000  [facility] CM/SW Contact:  Ella Bodo, RN Phone Number: 03/12/2019, 4:23 PM   Clinical Narrative: 18 y.o. male with no reported past medical hx who was brought in as a level 1 to [facility] after he was found down after a suspected fall. Per EMS, patient was at a construction site at the [place]. He was suspected to have a fall from 25+ft through a skylight. + LOC. This was unwitnessed. Awoke and became combative. CXR and Pelvis xray unremarkable. CT seft subdural hematoma has diffused along the cerebral convexity with maximal thickness decreased from 4 to 1 mm and midline shift decreased to 2 mm. Further injuries include G3 splenic lac with parasplenic hematoma and active extrav - s/p AE 11/19. Left Pulm Contusion/small PTX/HPTX, Left Rib Fx 6, 7, 8.  T7, T8, T9 and L3 TP fxs. PTA, pt independent, lives at home with aunt and uncle.  PT/OT recommending CIR, but was declined due to being too high level for rehab.    Pt medically stable for discharge home today with aunt/uncle; will arrange OP PT/OT and ST at [facility].  Referrals made in Epic and rehab information placed on AVS.  Pt is uninsured, but is eligible for medication assistance through [place] sent to [place] to be filled using [place] letter.        Final next level of care: OP Rehab Barriers to Discharge: Barriers Resolved            Discharge Plan and Services   Discharge Planning Services: CM Consult, Medication Assistance                                 Social Determinants of Health (SDOH) Interventions     Readmission Risk Interventions Readmission Risk Prevention Plan 03/12/2019  Post Dischage Appt Complete  Medication Screening Complete  Transportation Screening Complete   Reinaldo Raddle, RN, BSN  Trauma/Neuro ICU Case  Manager [phone]

## 2019-03-12 NOTE — Progress Notes (Signed)
Inpatient Rehab Admissions:  Inpatient Rehab Consult received.  Discussed pt in trauma rounds.  Pt ambulating 300' min guard/min assist, min guard to supervision for ADLs.  Feel pt does not require the high intensity, or duration of a CIR stay.  Will sign off at this time.    Signed: Shann Medal, PT, DPT Admissions Coordinator [phone] 03/12/19  3:06 PM

## 2019-03-12 NOTE — Consult Note (Addendum)
Physical Medicine and Rehabilitation Consult Reason for Consult: Decreased functional mobility Referring Physician: Trauma services   HPI: Allen Allen is a 18 y.o. right-handed non-English-speaking male with unremarkable past medical history on no prescription medications.  Presented 03/08/2019 after being found down after suspected fall.  Per EMS [place]  He was suspected to have fallen 25 feet through a sky light with loss of conscious.  This was unwitnessed.  Patient was reportedly combative in route to the ED.  Cranial CT scan showed small left cerebral convexity acute subdural hematoma measuring up to 4 to 5 mm in maximal thickness 3 mm left to right midline shift.  No acute cervical spine fracture noted.  CT of the chest abdomen pelvis showed small left hemopneumothorax.  Partial collapse of left lower lobe with extensive contusion and small traumatic pneumatocele consistent with laceration.  Few scattered contusions in the posterior left upper lobe.  Acute nondisplaced fractures of the left sixth through eighth ribs and left T6-T9 transverse process fracture as well as grade 4 splenic injury.  Acute mildly displaced fractures of the left iliac wing involving the physis.  Acute nondisplaced fracture of the left L3 transverse process.  Patient did initially require intubation through 03/09/2019.  Admission chemistries with alcohol negative, potassium 3.2, WBC 20,800, SARS coronavirus negative.  Neurosurgery Dr. Arnoldo Morale follow-up for SDH with conservative care.  Orthopedic surgery follow-up for left iliac wing fracture Dr. Doreatha Martin and advised weightbearing as tolerated.  Patient did undergo visceral angiogram and splenic artery embolization per interventional radiology.  Patient was cleared to initiate Lovenox for DVT prophylaxis.  Tolerating a regular diet.  Therapy evaluations completed with recommendations of physical medicine rehab  consult.   Review of Systems  Unable to perform ROS: Acuity of condition   History reviewed. No pertinent past medical history. Past Surgical History:  Procedure Laterality Date   IR ANGIOGRAM SELECTIVE EACH ADDITIONAL VESSEL  03/08/2019   IR ANGIOGRAM VISCERAL SELECTIVE  03/08/2019   IR ANGIOGRAM VISCERAL SELECTIVE  03/08/2019   IR EMBO ART  VEN HEMORR LYMPH EXTRAV  INC GUIDE ROADMAPPING  03/08/2019   IR FLUORO GUIDE CV LINE LEFT  03/08/2019   IR US GUIDE VASC ACCESS LEFT  03/08/2019   IR US GUIDE VASC ACCESS RIGHT  03/08/2019   RADIOLOGY WITH ANESTHESIA N/A 03/08/2019   Procedure: IR FOR EMBOLIZATION;  Surgeon: Radiologist, Medication, MD;  Location: [facility];  Service: Radiology;  Laterality: N/A;   History reviewed. No pertinent family history. Social History:  has no history on file for tobacco, alcohol, and drug. Allergies: No Known Allergies Medications Prior to Admission  Medication Sig Dispense Refill   Dextromethorphan-guaiFENesin (COUGH/CHEST CONGESTION DM) 10-100 MG/5ML liquid Take 5 mLs by mouth 3 (three) times daily as needed. Cough      Home: Home Living Family/patient expects to be discharged to:: Private residence Living Arrangements: Other relatives Available Help at Discharge: Family Type of Home: House Home Access: Level entry Home Layout: One level Bathroom Shower/Tub: Multimedia programmer: Standard Home Equipment: None Additional Comments: poor historian unsure  of accuracy states lives with cousin and her baby  Lives With: (cousin)  Functional History: Prior Function Level of Independence: Independent Comments: Pt reports living with his uncle. Reports independence with all BADLs, works as a Environmental health practitioner Status:  Mobility: Bed Mobility Overal bed mobility: Needs Assistance Bed Mobility: Sit to Supine, Supine to Sit Supine to  sit: Supervision Sit to supine: Supervision General bed mobility comments:  supervision for safety Transfers Overall transfer level: Needs assistance Equipment used: None Transfers: Sit to/from Stand Sit to Stand: Supervision General transfer comment: for safety due to recent drop in BP; asymptomatic x 2 reps Ambulation/Gait Ambulation/Gait assistance: Min guard, Min assist Gait Distance (Feet): 300 Feet Assistive device: None Gait Pattern/deviations: Step-through pattern, Decreased stride length, Drifts right/left, Narrow base of support General Gait Details: unsteady, especially with path-finding and head turns; reaches to catch himself on railing vs min assist to recover Gait velocity: decreased Gait velocity interpretation: <1.31 ft/sec, indicative of household ambulator    ADL: ADL Overall ADL's : Needs assistance/impaired Eating/Feeding: Modified independent, Sitting Grooming: Oral care, Wash/dry hands, Min guard, Standing Grooming Details (indicate cue type and reason): Pt required min guard A for oral care and hand hygiene for balance.  Upper Body Bathing: Supervision/ safety, Sitting Lower Body Bathing: Min guard, Sit to/from stand Upper Body Dressing : Supervision/safety, Sitting Lower Body Dressing: Min guard, Sit to/from stand Lower Body Dressing Details (indicate cue type and reason): Pt reached to floor to adjust socks sitting EOB. min guard A for safety Toilet Transfer: Min guard, Ambulation, Regular Toilet Toilet Transfer Details (indicate cue type and reason): Pt began to walk back towards bed, but abruptly turned around and walked towards toilet. Pt completed toileting with min guard A for safety and balance.  Toileting- Architect and Hygiene: Min guard, Sit to/from stand Toileting - Architect Details (indicate cue type and reason): required min guard A for safety and balance Functional mobility during ADLs: Min guard, Minimal assistance General ADL Comments: Pt performed UB ADLs with supervision- min guard A, LB  ADLs with min guard A, and functional mobility with min guard A- min A. Pt had several episodes of LOB during simple path finding, requiring min A to correct.   Cognition: Cognition Overall Cognitive Status: Impaired/Different from baseline Arousal/Alertness: Awake/alert Orientation Level: Oriented to person, Oriented to place, Oriented to time Attention: Focused, Sustained, Selective Focused Attention: Appears intact Sustained Attention: Appears intact Selective Attention: Appears intact Memory: Impaired Memory Impairment: Decreased short term memory, Storage deficit Decreased Short Term Memory: Verbal basic Awareness: Impaired Awareness Impairment: Intellectual impairment, Emergent impairment Problem Solving: Impaired Problem Solving Impairment: Verbal basic, Functional basic Executive Function: Reasoning, Initiating, Sequencing Reasoning: Impaired Reasoning Impairment: Verbal basic Sequencing: Impaired Sequencing Impairment: Functional basic Initiating: Impaired Initiating Impairment: Functional basic, Verbal basic Safety/Judgment: Impaired Rancho Mirant Scales of Cognitive Functioning: Automatic/appropriate Cognition Arousal/Alertness: Awake/alert Behavior During Therapy: WFL for tasks assessed/performed Overall Cognitive Status: Impaired/Different from baseline Area of Impairment: Memory, Problem solving Orientation Level: Place, Time, Situation Current Attention Level: Selective Memory: Decreased short-term memory(difficulty remembering room number and how to read room sign) Safety/Judgement: Decreased awareness of safety, Decreased awareness of deficits Awareness: Intellectual Problem Solving: Requires verbal cues General Comments: aware of balance deficits; able to read handout provided re: concussion (printed in Spanish); despite shown how to read door signage (which is confusing) he had difficulty remembering to look for last number as room number (not the floor  number)  Blood pressure 110/73, pulse (!) 105, temperature 98.4 F (36.9 C), resp. rate 20, height  (1.778 m), weight 61.9 kg, SpO2 98 %. Physical Exam  Neurological:  Patient is non-English speaking.  He is up attempting to walk around in the room.  He did follow some simple demonstrated commands.  Gen: no distress, normal appearing HEENT: oral mucosa pink and moist, NCAT  Cardio: Reg rate Chest: normal effort, normal rate of breathing Abd: soft, non-distended Ext: no edema Skin: intact Neuro: AOx3 Musculoskeletal: Antigravity in all extremities. Exam limited by patient's fatigue Psych: pleasant, normal affect  Results for orders placed or performed during the hospital encounter of 03/08/19 (from the past 24 hour(s))  CBC     Status: Abnormal   Collection Time: 03/12/19  3:00 AM  Result Value Ref Range   WBC 8.0 4.0 - 10.5 K/uL   RBC 3.67 (L) 4.22 - 5.81 MIL/uL   Hemoglobin 11.2 (L) 13.0 - 17.0 g/dL   HCT 63.8 (L) 75.6 - 43.3 %   MCV 85.8 80.0 - 100.0 fL   MCH 30.5 26.0 - 34.0 pg   MCHC 35.6 30.0 - 36.0 g/dL   RDW 29.5 18.8 - 41.6 %   Platelets 155 150 - 400 K/uL   nRBC 0.0 0.0 - 0.2 %  Basic metabolic panel     Status: Abnormal   Collection Time: 03/12/19  3:00 AM  Result Value Ref Range   Sodium 134 (L) 135 - 145 mmol/L   Potassium 3.3 (L) 3.5 - 5.1 mmol/L   Chloride 101 98 - 111 mmol/L   CO2 25 22 - 32 mmol/L   Glucose, Bld 104 (H) 70 - 99 mg/dL   BUN 6 6 - 20 mg/dL   Creatinine, Ser 6.06 0.61 - 1.24 mg/dL   Calcium 8.6 (L) 8.9 - 10.3 mg/dL   GFR calc non Af Amer >60 >60 mL/min   GFR calc Af Amer >60 >60 mL/min   Anion gap 8 5 - 15  Magnesium     Status: None   Collection Time: 03/12/19  3:00 AM  Result Value Ref Range   Magnesium 2.0 1.7 - 2.4 mg/dL  Phosphorus     Status: None   Collection Time: 03/12/19  3:00 AM  Result Value Ref Range   Phosphorus 4.1 2.5 - 4.6 mg/dL   No results found.   Assessment/Plan: Diagnosis: Impaired mobility and ADLs  secondary to fall resulting in subdural hematoma 1. Does the need for close, 24 hr/day medical supervision in concert with the patient's rehab needs make it unreasonable for this patient to be served in a less intensive setting? Yes 2. Co-Morbidities requiring supervision/potential complications: hemopneumothorax, rib fractures, splenic injury, nondisplaced fracture of left L3 transverse practice.  3. Due to bowel management, safety, skin/wound care, disease management, medication administration, pain management and patient education, does the patient require 24 hr/day rehab nursing? Yes 4. Does the patient require coordinated care of a physician, rehab nurse, therapy disciplines of to address physical and functional deficits in the context of the above medical diagnosis(es)? Yes Addressing deficits in the following areas: balance, endurance, locomotion, strength, transferring, bathing, dressing, feeding, grooming, toileting and psychosocial support 5. Can the patient actively participate in an intensive therapy program of at least 3 hrs of therapy per day at least 5 days per week? Yes 6. The potential for patient to make measurable gains while on inpatient rehab is excellent 7. Anticipated functional outcomes upon discharge from inpatient rehab are modified independent  with PT, modified independent with OT, independent with SLP. 8. Estimated rehab length of stay to reach the above functional goals is: 10-14 days 9. Anticipated discharge destination: Home 10. Overall Rehab/Functional Prognosis: excellent  RECOMMENDATIONS: This patient's condition is appropriate for continued rehabilitative care in the following setting: CIR Patient has agreed to participate in recommended program. Yes Note that insurance prior authorization may  be required for reimbursement for recommended care.  Comment: Allen Allen has significant PT, OT, and SLP needs and would benefit from intensive inpatient rehabilitation. He  would be able to tolerate 3 hours of daily therapy.   Mcarthur RossettiDaniel J Angiulli, PA-C 03/12/2019   I have personally performed a face to face diagnostic evaluation, including, but not limited to relevant history and physical exam findings, of this patient and developed relevant assessment and plan.  Additionally, I have reviewed and concur with the physician assistant's documentation above.  Sula SodaKrutika Simcha Farrington, MD

## 2019-03-12 NOTE — Discharge Summary (Signed)
[facility] Discharge Summary   Patient ID: Allen Allen MRN: [id number] DOB/AGE: [birth year] 18 y.o.  Admit date: 03/08/2019 Discharge date: 03/12/2019  Admitting Diagnosis: Fall from 25+ft G3 splenic lac with parasplenic hematoma and active extravasation Left iliac wing fracture with surrounding hematoma Left SDH Left Pulm Contusion/small PTX/HPTX Left Rib Fx 6, 7, 8  T7, T8, T9 and L3 TP fractures  Discharge Diagnosis Patient Active Problem List   Diagnosis Date Noted  . Spleen laceration extending into parenchyma with open wound into cavity 03/08/2019    Consultants Orthopedics Neurosurgery Interventional radiology  Imaging: No results found.  Procedures Dr. Lowella Dandy (03/08/19) - Visceral angiogram and splenic artery embolization  Hospital Course:  Allen Allen is a Irena Reichmann male who was brought into [facility] 11/19 as a level 1 trauma after being found down after a suspected fall. Per [place] suspected to have a fall from 25+ft through a skylight. + LOC. This was unwitnessed. Upon arrival to trauma bay, patient was able to state name and state her hurt all over. He was combative and intubated in trauma bay.  Workup showed Grade 3 splenic laceration with parasplenic hematoma and active extravasation, Left iliac wing fracture with surrounding hematoma, Left SDH, Left Pulm Contusion/small PTX/HPTX, Left Rib Fracture 6/7/8, T7/8/9 and L3 transverse process fractures.  Patient was taken to IR for Visceral angiogram and splenic artery embolization. Tolerated procedure well and was admitted to the trauma unit post-procedure. Hemoglobin stabilized. Orthopedics was consulted for iliac wing fracture and recommended nonoperative management, WBAT BLE. Neurosurgery was consulted for SDH and recommended close follow up with repeat CT scan; this remained stable and the patient required no neurosurgical intervention.   Patient worked with therapies during this admission who initially recommended inpatient rehab when medically stable for discharge. Patient progressed well and was able to be discharged with outpatient therapy referrals. On 03/12/19 the patient was voiding well, tolerating diet, ambulating well, pain well controlled, vital signs stable and felt stable for discharge home.  Patient will follow up as below and knows to call with questions or concerns.    I have personally reviewed the patients medication history on the [place] controlled substance database.     Allergies as of 03/12/2019   No Known Allergies     Medication List    STOP taking these medications   Cough/Chest Congestion DM 10-100 MG/5ML liquid Generic drug: Dextromethorphan-guaiFENesin     TAKE these medications   docusate sodium 100 MG capsule Commonly known as: COLACE Take 1 capsule (100 mg total) by mouth 2 (two) times daily.   oxyCODONE 5 MG immediate release tablet Commonly known as: Oxy IR/ROXICODONE Take 1 tablet (5 mg total) by mouth every 6 (six) hours as needed for severe pain.        Follow-up Information    Haddix, Gillie Manners, MD. Call.   Specialty: Orthopedic Surgery Why: call to arrange follow up regarding orthopedic injuries Contact information: [address] [place] [address] [phone]        Tressie Stalker, MD. Call.   Specialty: Neurosurgery Why: call to arrange follow up regarding head injury Contact information: [address] [place] [address] [phone]        [facility]. Go on 04/05/2019.   Why: Your appointment is 12/17 9:00 am  Please arrive 30 minutes prior to your appointment to check in and fill out paperwork. Bring  photo ID and insurance information. Contact information: [place] [address] [phone]          Signed: Wellington Hampshire, [facility] 03/12/2019, 4:03 PM Please see Amion for pager number during day hours 7:00am-4:30pm

## 2019-03-12 NOTE — Progress Notes (Signed)
Trauma Critical Care Follow Up Note  Subjective:    Overnight Issues: NAEON  Objective:  Vital signs for last 24 hours: Temp:  [98.3 F (36.8 C)-98.4 F (36.9 C)] 98.4 F (36.9 C) (11/23 0725) Pulse Rate:  [94-105] 105 (11/23 0725) Resp:  [17-20] 20 (11/23 0725) BP: (100-118)/(68-79) 110/73 (11/23 0725) SpO2:  [97 %-98 %] 98 % (11/23 0725)  Hemodynamic parameters for last 24 hours:    Intake/Output from previous day: 11/22 0701 - 11/23 0700 In: 240 [P.O.:240] Out: 1025 [Urine:1025]  Intake/Output this shift: No intake/output data recorded.  Vent settings for last 24 hours:    Physical Exam:  Gen: no distress Neuro: interactive Neck: supple CV: RRR Pulm: unlabored breathing Abd: soft, nontender GU: clear, yellow urine Extr: wwp, no edema   Results for orders placed or performed during the hospital encounter of 03/08/19 (from the past 24 hour(s))  CBC     Status: Abnormal   Collection Time: 03/12/19  3:00 AM  Result Value Ref Range   WBC 8.0 4.0 - 10.5 K/uL   RBC 3.67 (L) 4.22 - 5.81 MIL/uL   Hemoglobin 11.2 (L) 13.0 - 17.0 g/dL   HCT 31.5 (L) 39.0 - 52.0 %   MCV 85.8 80.0 - 100.0 fL   MCH 30.5 26.0 - 34.0 pg   MCHC 35.6 30.0 - 36.0 g/dL   RDW 13.6 11.5 - 15.5 %   Platelets 155 150 - 400 K/uL   nRBC 0.0 0.0 - 0.2 %  Basic metabolic panel     Status: Abnormal   Collection Time: 03/12/19  3:00 AM  Result Value Ref Range   Sodium 134 (L) 135 - 145 mmol/L   Potassium 3.3 (L) 3.5 - 5.1 mmol/L   Chloride 101 98 - 111 mmol/L   CO2 25 22 - 32 mmol/L   Glucose, Bld 104 (H) 70 - 99 mg/dL   BUN 6 6 - 20 mg/dL   Creatinine, Ser 0.72 0.61 - 1.24 mg/dL   Calcium 8.6 (L) 8.9 - 10.3 mg/dL   GFR calc non Af Amer >60 >60 mL/min   GFR calc Af Amer >60 >60 mL/min   Anion gap 8 5 - 15  Magnesium     Status: None   Collection Time: 03/12/19  3:00 AM  Result Value Ref Range   Magnesium 2.0 1.7 - 2.4 mg/dL  Phosphorus     Status: None   Collection Time: 03/12/19   3:00 AM  Result Value Ref Range   Phosphorus 4.1 2.5 - 4.6 mg/dL    Assessment & Plan: Present on Admission: . Spleen laceration extending into parenchyma with open wound into cavity    LOS: 4 days   Additional comments:I reviewed the patient's new clinical lab test results.   and I reviewed the patients new imaging test results.    71M s/p fall from 25+ft  G3 splenic lac with parasplenic hematoma and active extrav - s/p AE 11/19 Left iliac wing fracture with surrounding hematoma - Ortho consulted, non-op mgmt Left SDH - NSGY on board, repeat head CT stable 11/20 Left Pulm Contusion/small PTX/HPTX - pulm toilet Left Rib Fx 6, 7, 8 - Multimodal pain control  T7, T8, T9 and L3 TP fxs - Multimodal pain control  VDRF - extubated 11/20 FEN - reg diet, replete K VTE - SCDs, LMWH Dispo - possibly home today pending re-eval by PT   Jesusita Oka, MD Trauma & General Surgery Please use [email or web address] to contact on  call provider  03/12/2019  *Care during the described time interval was provided by me. I have reviewed this patient's available data, including medical history, events of note, physical examination and test results as part of my evaluation.

## 2019-03-12 NOTE — Plan of Care (Signed)
  Problem: Activity: Goal: Ability to tolerate increased activity will improve 03/12/2019 1555 by Lawanda Cousins, RN Outcome: Progressing 03/12/2019 1554 by Lawanda Cousins, RN Outcome: Progressing   Problem: Respiratory: Goal: Ability to maintain a clear airway and adequate ventilation will improve 03/12/2019 1555 by Lawanda Cousins, RN Outcome: Progressing 03/12/2019 1554 by Lawanda Cousins, RN Outcome: Progressing   Problem: Role Relationship: Goal: Method of communication will improve 03/12/2019 1555 by Lawanda Cousins, RN Outcome: Progressing 03/12/2019 1554 by Lawanda Cousins, RN Outcome: Progressing

## 2019-03-12 NOTE — Progress Notes (Signed)
Subjective: The patient is alert and pleasant.  He is in no apparent distress.  He does not speak [place].  Objective: Vital signs in last 24 hours: Temp:  [98.3 F (36.8 C)-98.4 F (36.9 C)] 98.4 F (36.9 C) (11/23 0725) Pulse Rate:  [94-105] 105 (11/23 0725) Resp:  [18-20] 20 (11/23 0725) BP: (100-118)/(68-79) 110/73 (11/23 0725) SpO2:  [97 %-98 %] 98 % (11/23 0725) Estimated body mass index is 19.58 kg/m as calculated from the following:   Height as of this encounter: 5\' 10"  (1.778 m).   Weight as of this encounter: 61.9 kg.   Intake/Output from previous day: 11/22 0701 - 11/23 0700 In: 240 [P.O.:240] Out: 1025 [Urine:1025] Intake/Output this shift: No intake/output data recorded.  Physical exam the patient is alert and pleasant.  He follows commands.  He moves all 4 extremities well.  Lab Results: Recent Labs    03/11/19 0550 03/12/19 0300  WBC 9.1 8.0  HGB 11.3* 11.2*  HCT 32.4* 31.5*  PLT 139* 155   BMET Recent Labs    03/11/19 0550 03/12/19 0300  NA 134* 134*  K 3.3* 3.3*  CL 101 101  CO2 23 25  GLUCOSE 99 104*  BUN 8 6  CREATININE 0.67 0.72  CALCIUM 8.7* 8.6*    Studies/Results: No results found.  Assessment/Plan: Traumatic subdural hematoma, head injury: The patient is doing well.  He can follow-up with me as needed.  I will sign off.  Please call if I can help.  LOS: 4 days     Ophelia Charter 03/12/2019, 7:55 AM

## 2019-03-12 NOTE — Progress Notes (Signed)
Physical Therapy Treatment Patient Details Name: Allen Allen MRN: [id number] DOB: Sep 08, 2000 Today's Date: 03/12/2019    History of Present Illness 18 y.o. male with no reported past medical hx who was brought in as a level 1 to [facility] after he was found down after a suspected fall. Per EMS, [place] He was suspected to have a fall from 25+ft through a skylight. + LOC. This was unwitnessed. Awoke and became combative. CXR and Pelvis xray unremarkable. CT seft subdural hematoma has diffused along the cerebral convexity with maximal thickness decreased from 4 to 1 mm and midline shift decreased to 2 mm. Further injuries include G3 splenic lac with parasplenic hematoma and active extrav - s/p AE 11/19. Left Pulm Contusion/small PTX/HPTX, Left Rib Fx 6, 7, 8.  T7, T8, T9 and L3 TP fxs.    PT Comments    Patient up in chair. Able to state his deficits have been pain, imbalance, and dizziness (using phone interpreter). Denies dizziness throughout session. Gait remains slightly imbalanced (pt able to recover without physical assist--tends to drift left/right, especially with head turns). Scored 15/24 on Dynamic Gait Index, indicating higher fall risk--however some decr velocity and delay in response to assessment likely due to using interpreter. Romberg eyes open 30+sec; romberg eyes closed 15 sec with falling to his left and required assist to recover (very delayed stepping response). Based on continued fall risk, feel he can benefit from CIR prior to discharge. Also feel reassessment by OT and SLP to further assess cognitive deficits would be beneficial.     Follow Up Recommendations  Supervision/Assistance - 24 hour;CIR(supervision 24/7 per aunt (per pt))     Equipment Recommendations  None recommended by PT    Recommendations for Other Services       Precautions / Restrictions Precautions Precautions: Fall Restrictions Weight Bearing  Restrictions: Yes RLE Weight Bearing: Weight bearing as tolerated LLE Weight Bearing: Weight bearing as tolerated    Mobility  Bed Mobility                  Transfers Overall transfer level: Needs assistance Equipment used: None Transfers: Sit to/from Stand Sit to Stand: Supervision         General transfer comment: for safety due to recent drop in BP; asymptomatic x 2 reps  Ambulation/Gait Ambulation/Gait assistance: Min guard;Min assist Gait Distance (Feet): 300 Feet Assistive device: None Gait Pattern/deviations: Step-through pattern;Decreased stride length;Drifts right/left;Narrow base of support Gait velocity: decreased   General Gait Details: unsteady, especially with path-finding and head turns; reaches to catch himself on railing vs min assist to recover   Stairs             Wheelchair Mobility    Modified Rankin (Stroke Patients Only)       Balance Overall balance assessment: Needs assistance Sitting-balance support: Feet supported;No upper extremity supported Sitting balance-Leahy Scale: Good     Standing balance support: No upper extremity supported;During functional activity Standing balance-Leahy Scale: Fair Standing balance comment: Pt had some episodes of LOB with simple path finding, min assist to regain balance                 Standardized Balance Assessment Standardized Balance Assessment : Dynamic Gait Index   Dynamic Gait Index Level Surface: Mild Impairment Change in Gait Speed: Mild Impairment Gait with Horizontal Head Turns: Moderate Impairment Gait with Vertical Head Turns: Mild Impairment Gait and Pivot Turn: Mild Impairment Step Over  Obstacle: Mild Impairment Step Around Obstacles: Mild Impairment Steps: Mild Impairment Total Score: 15      Cognition Arousal/Alertness: Awake/alert Behavior During Therapy: WFL for tasks assessed/performed Overall Cognitive Status: Impaired/Different from baseline Area of  Impairment: Memory;Problem solving               Rancho Levels of Cognitive Functioning Rancho [employer] Scales of Cognitive Functioning: Automatic/appropriate   Current Attention Level: Selective Memory: Decreased short-term memory(difficulty remembering room number and how to read room sign)     Awareness: Intellectual Problem Solving: Requires verbal cues General Comments: aware of balance deficits; able to read handout provided re: concussion (printed in Spanish); despite shown how to read door signage (which is confusing) he had difficulty remembering to look for last number as room number (not the floor number)      Exercises      General Comments General comments (skin integrity, edema, etc.): Engineer, site (phone interpreter) used with PT holding phone; some change in velocity delays due to delay in translation      Pertinent Vitals/Pain Pain Assessment: 0-10 Pain Score: 5  Pain Location: Back Pain Descriptors / Indicators: Discomfort;Sore Pain Intervention(s): Monitored during session;Limited activity within patient's tolerance;Repositioned;Patient requesting pain meds-RN notified    Home Living                      Prior Function            PT Goals (current goals can now be found in the care plan section) Acute Rehab PT Goals Patient Stated Goal: go home Time For Goal Achievement: 03/24/19 Potential to Achieve Goals: Good Progress towards PT goals: Progressing toward goals    Frequency    Min 5X/week      PT Plan Current plan remains appropriate    Co-evaluation              AM-PAC PT "6 Clicks" Mobility   Outcome Measure  Help needed turning from your back to your side while in a flat bed without using bedrails?: None Help needed moving from lying on your back to sitting on the side of a flat bed without using bedrails?: None Help needed moving to and from a bed to a chair (including a wheelchair)?: A Little Help  needed standing up from a chair using your arms (e.g., wheelchair or bedside chair)?: A Little Help needed to walk in hospital room?: A Little Help needed climbing 3-5 steps with a railing? : None 6 Click Score: 21    End of Session Equipment Utilized During Treatment: Gait belt Activity Tolerance: Patient tolerated treatment well Patient left: with call bell/phone within reach;in chair Nurse Communication: Mobility status PT Visit Diagnosis: Unsteadiness on feet (R26.81);Muscle weakness (generalized) (M62.81);Difficulty in walking, not elsewhere classified (R26.2);Pain Pain - part of body: (back)     Time: 9509-3267 PT Time Calculation (min) (ACUTE ONLY): 29 min  Charges:  $Gait Training: 23-37 mins                      Barry Brunner, PT Pager [phone]    Rexanne Mano 03/12/2019, 10:22 AM

## 2019-03-12 NOTE — Progress Notes (Signed)
Rehab Admissions Coordinator Note:  Patient was screened by Cleatrice Burke for appropriateness for an Inpatient Acute Rehab Consult per therapy recommendations but felt pt progressing rapidly. May progress to not need to be considered for an inpt rehab admit. I await updates today.Cleatrice Burke RN MSN 03/12/2019, 8:33 AM  I can be reached at [phone].

## 2019-03-12 NOTE — Discharge Instructions (Signed)
Fractura de costillas Rib Fracture  Una fractura de costilla es la ruptura o fisura de uno de los huesos que la forman. Las costillas son Otila Kluvercomo una jaula que rodea la parte superior del pecho. La fractura o fisura de una costilla puede ser dolorosa pero no causa otros problemas. La mayora de las fracturas de costillas se curan por s solas en un plazo de 1 a 3 meses. Siga estas indicaciones en su casa: Control del dolor, la rigidez y la hinchazn  Si se lo indican, aplique hielo sobre la zona lesionada. ? Ponga el hielo en una bolsa plstica. ? Coloque una FirstEnergy Corptoalla entre la piel y la bolsa de hielo. ? Coloque el hielo durante 20minutos, 2 o 3veces por da.  Tome los medicamentos de venta libre y los recetados solamente como se lo haya indicado el mdico. Actividad  Evite las actividades que puedan causar dolor en la zona lesionada. Proteja la zona lesionada.  Aumente lentamente la actividad segn las indicaciones del mdico. Instrucciones generales  Haga ejercicios de respiracin profunda como se lo haya indicado el mdico. Es posible que le indiquen lo siguiente: ? Sports administratorHaga respiraciones profundas varias veces al Futures traderda. ? Tosa varias veces al da mientras abraza una almohada. ? Use un dispositivo (espirmetro de incentivo) para Education officer, environmentalrealizar respiraciones profundas varias veces al da.  Beba suficiente lquido para mantener el pis (orina) claro o de color amarillo plido.  No use cinturones ni sujetadores. No dejan que respire profundamente.  Concurra a todas las visitas de control como se lo haya indicado el mdico. Esto es importante. Comunquese con un mdico si:  Tiene fiebre. Solicite ayuda de inmediato si:  Tiene dificultad para respirar.  Le falta el aire.  No puede dejar de toser.  Tose y despide saliva espesa o con sangre (esputo).  Tiene Programme researcher, broadcasting/film/videomalestar estomacal (nuseas), vomita o tiene dolor de panza (abdominal).  El dolor empeora y los medicamentos no Public house managerhacen  efecto. Resumen  Burkina Fasona fractura de costilla es la ruptura o fisura de uno de los huesos que la forman.  Aplique hielo sobre la zona lesionada y tome los medicamentos para Primary school teachercalmar el dolor como se lo haya indicado el mdico.  Haga respiraciones profundas y tosa varias veces al da. Abrace una almohada cada vez que tosa. Esta informacin no tiene Theme park managercomo fin reemplazar el consejo del mdico. Asegrese de hacerle al mdico cualquier pregunta que tenga. Document Released: 12/06/2012 Document Revised: 12/16/2016 Document Reviewed: 12/16/2016 Elsevier Patient Education  2020 Elsevier Inc.    Hematoma subdural Subdural Hematoma  Un hematoma subdural es la acumulacin de sangre entre el cerebro y su Chemical engineermembrana exterior [place](duramadre). A medida que aumenta la cantidad de [place], se acumula presin en el cerebro. Hay dos tipos de hematoma subdural:  Agudo. Este tipo se desarrolla tras un golpe fuerte y directo en la cabeza, y provoca que se acumule sangre rpidamente. Esto es Engineer, drillinguna emergencia mdica. Si no se diagnostica y se trata rpidamente, puede provocar un dao cerebral grave o la muerte.  Crnico. Esto es cuando la hemorragia se produce ms lentamente, durante semanas o meses. En algunos casos, este tipo no causa sntomas. Cules son las causas? Esta afeccin es causada por el sangrado (hemorragia) de un vaso sanguneo roto. En la International Business Machinesmayora de los casos, un vaso sanguneo se rompe y Medical illustratorsangra debido a una lesin en la cabeza, como un golpe fuerte y Engineering geologistdirecto. Las lesiones en la cabeza pueden ocurrir en accidentes automovilsticos, cadas, agresiones o al Microbiologistpracticar deportes. En casos pocos  frecuentes, una hemorragia puede ocurrir sin causa aparente (espontneamente), en especial si toma diluyentes de la sangre (anticoagulantes). Qu incrementa el riesgo? Es ms probable que Orthoptist en:  Personas de edad avanzada.  Bebs.  Personas que toman anticoagulantes.  Personas que tienen una  lesin la cabeza.  Personas que consumen alcohol en exceso. Cules son los signos o los sntomas? Los sntomas de esta afeccin pueden variar segn el tamao del hematoma. Los sntomas pueden ser leves, graves o potencialmente mortales. Incluyen los siguientes:  Dolores de [place].  Nuseas o vmitos.  Cambios en la visin, como visin doble o prdida de la visin.  Cambios en el habla o dificultad para comprender lo que [employer].  Prdida del equilibrio o dificultad para caminar.  Debilidad, entumecimiento u hormigueo en los brazos o las piernas, especialmente en un lado del cuerpo.  Convulsiones.  Cambios en la personalidad.  Somnolencia.  Prdida de [employer].  Prdida del conocimiento.  Coma. Los sntomas de un hematoma subdural agudo pueden desarrollarse en minutos u horas. Los sntomas de un hematoma subdural crnico pueden desarrollarse en semanas o meses. Cmo se diagnostica? Esta afeccin se diagnostica en funcin de los [place] de lo siguiente:  Un examen fsico.  Estudios de resistencia, reflejos, coordinacin, sentidos, forma de caminar Scientist, research (medical)) y movimientos faciales y oculares (examen neurolgico).  Pruebas de diagnstico por imgenes, como Health visitor (RM) o exploracin por tomografa computarizada (TC). Cmo se trata? El tratamiento de esta afeccin depende del tipo de hematoma y de su gravedad. El tratamiento para el hematoma agudo puede incluir lo siguiente:  [place] de emergencia para drenar la sangre o extirpar un cogulo de [place].  Medicamentos que ayudan al cuerpo a eliminar el exceso de lquidos (diurticos). Estos pueden ayudar a disminuir la presin en el cerebro.  Respiracin asistida (ventilacin mecnica). El tratamiento para el hematoma crnico puede incluir lo siguiente:  Observacin y reposo en cama en el hospital.  Clementeen Hoof. Si toma anticoagulantes, es posible que deba dejar de tomarlos por un breve  tiempo. Tambin se le puede administrar un medicamento para las convulsiones (anticonvulsivo). A veces, no se necesita tratamiento para el hematoma subdural crnico. Siga estas instrucciones en su casa: Actividad  Evite situaciones en las que podra lastimarse la cabeza nuevamente, como en deportes competitivos, deportes en la nieve cuesta abajo y equitacin. No realice estas actividades hasta que el mdico lo autorice. ? Use equipos de proteccin, como un casco, para participar en actividades como andar en bicicleta o deportes de contacto.  Evite la estimulacin visual excesiva mientras se recupera. Esto significa que limite cunto lee y el tiempo que est frente a una pantalla de telfono inteligente, tableta, computadora o televisin.  Haga reposo como se lo haya indicado el mdico. El descanso favorece la recuperacin del cerebro.  Trate de evitar las actividades que causan estrs fsico o mental. Marlou Starks al [place] o a la escuela segn lo indicado por el mdico.  No levante ningn objeto que pese ms de 5libras (2.3kg) o el lmite de peso que le hayan indicado, hasta que el mdico le diga que puede [place].  No conduzca, no ande en bicicleta ni use maquinaria pesada hasta que el mdico lo autorice.  Siempre use el cinturn de seguridad cuando se encuentre en un vehculo a motor. Consumo de alcohol  No beba alcohol si el mdico se lo prohbe.  Si bebe alcohol, limite la cantidad que consume a lo siguiente: ? De 0 a  1 medida por da para las mujeres. ? De 0 a 2 medidas por da para los hombres. Instrucciones generales  Controle sus sntomas y pdales a las personas que lo rodean que hagan lo mismo. La recuperacin de las lesiones cerebrales vara. Hable con su mdico sobre lo que Agricultural consultant.  Tome los medicamentos de venta libre y los recetados solamente como se lo haya indicado el mdico. No tome anticoagulantes ni antiinflamatorios no esteroideos (AINE) a menos que el mdico lo  autorice. Entre ellos se incluyen aspirina, ibuprofeno, naproxeno y warfarina.  Mantenga su hogar seguro para reducir los riesgos de cadas.  Concurra a todas las visitas de [address] se lo haya indicado el mdico. Esto es importante. Dnde buscar ms informacin  [employer] de Trastornos y Scientist, clinical (histocompatibility and immunogenetics) [employer] of Neurological Disorders and Stroke): ToledoAutomobile.co.uk  Academia Estadounidense de Corporate investment banker of Neurology, AAN): ComparePet.cz  Asociacin Estadounidense de Hotel manager (Brain Injury Association of America): www.biausa.org Solicite ayuda inmediatamente si:  Toma anticoagulantes y se cae o sufre un traumatismo leve en la cabeza. Si toma anticoagulantes, incluso una lesin muy pequea puede provocar un hematoma subdural.  Tiene un trastorno hemorrgico y se cae o sufre un traumatismo leve en la cabeza.  Desarrolla algunos de los siguientes sntomas despus de una lesin en la cabeza: ? Un lquido transparente que le sale por la nariz o los odos. ? Nuseas o vmitos. ? Cambios en el habla o dificultad para comprender lo que [employer]. ? Convulsiones. ? Somnolencia o disminucin del estado de [place]. ? Visin doble. ? Entumecimiento o incapacidad para moverse (parlisis) en cualquier parte del cuerpo. ? Dificultad para caminar o mala coordinacin. ? Dificultad para pensar. ? Confusin u olvidos. ? Cambios en la personalidad. ? Conducta irracional o agresiva. Estos sntomas pueden representar un problema grave que constituye Radio broadcast assistant. No espere a ver si los sntomas desaparecen. Solicite atencin mdica de inmediato. Comunquese con el servicio de emergencias de su localidad (911 en los [place]). No conduzca por sus propios medios [employer] hospital. Resumen  Un hematoma subdural es la acumulacin de sangre entre el cerebro y su Chemical engineer exterior (duramadre).  El tratamiento de esta  afeccin depende del tipo de hematoma subdural que tiene y de su gravedad.  Los sntomas pueden variar de leves a graves a Air cabin crew.  Controle sus sntomas y pdales a quienes lo rodean que hagan lo mismo. Esta informacin no tiene Theme park manager el consejo del mdico. Asegrese de hacerle al mdico cualquier pregunta que tenga. Document Released: 09/22/2007 Document Revised: 04/24/2018 Document Reviewed: 04/24/2018 Elsevier Patient Education  2020 [employer].

## 2019-03-13 ENCOUNTER — Encounter: Payer: Self-pay | Admitting: Student

## 2019-03-13 DIAGNOSIS — S32309A Unspecified fracture of unspecified ilium, initial encounter for closed fracture: Secondary | ICD-10-CM | POA: Insufficient documentation

## 2019-03-13 DIAGNOSIS — S270XXA Traumatic pneumothorax, initial encounter: Secondary | ICD-10-CM | POA: Insufficient documentation

## 2019-03-13 DIAGNOSIS — S2249XA Multiple fractures of ribs, unspecified side, initial encounter for closed fracture: Secondary | ICD-10-CM | POA: Insufficient documentation

## 2019-03-13 DIAGNOSIS — W1789XA Other fall from one level to another, initial encounter: Secondary | ICD-10-CM | POA: Insufficient documentation

## 2019-03-14 LAB — POC SARS CORONAVIRUS 2 AG: SARS Coronavirus 2 Ag: NEGATIVE

## 2020-04-26 IMAGING — CT CT HEAD W/O CM
4 series · 16 of 47 positions shown, 18 images · non-contrast
Comparison: None.

CLINICAL DATA: Fall.  Found down.

EXAM:
CT HEAD WITHOUT CONTRAST
CT CERVICAL SPINE WITHOUT CONTRAST
TECHNIQUE: Multidetector CT imaging of the head and cervical spine was
performed following the standard protocol without intravenous
contrast. Multiplanar CT image reconstructions of the cervical spine
were also generated.

[Series 3: head without · axial · non-contrast · 0.41mm/px · z∈[-139,-19]mm · 7 of 34 slices shown, 9 images]
[im 5/34  brain]
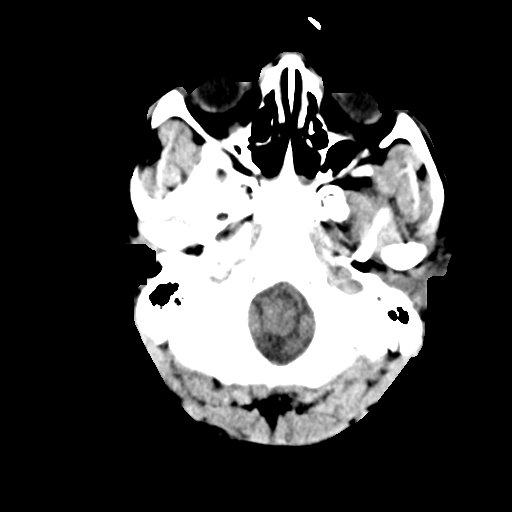
[im 5/34  bone]
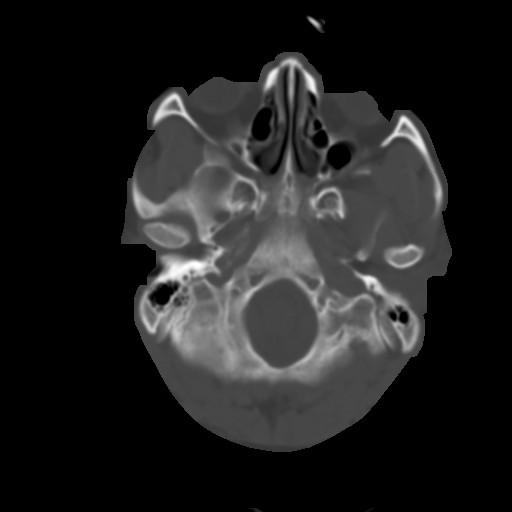
[im 9/34  brain]
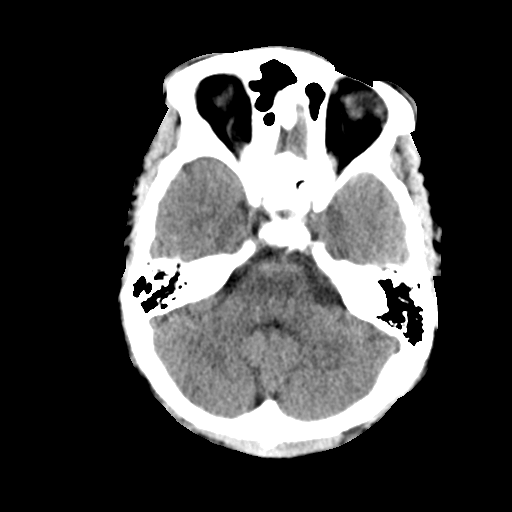
[im 13/34  brain]
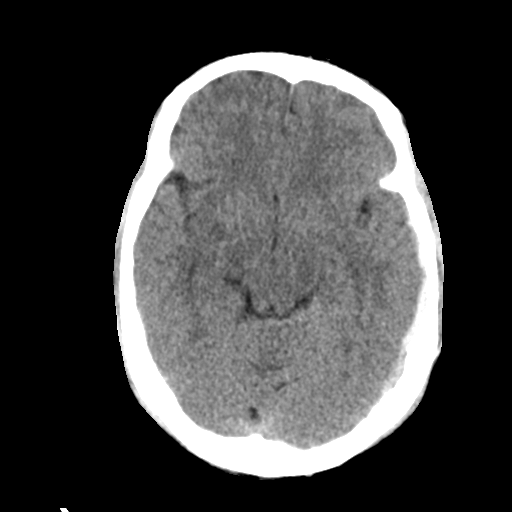
[im 17/34  brain]
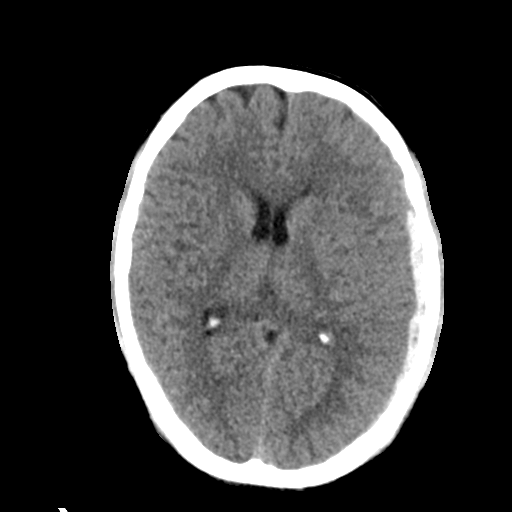
[im 21/34  brain]
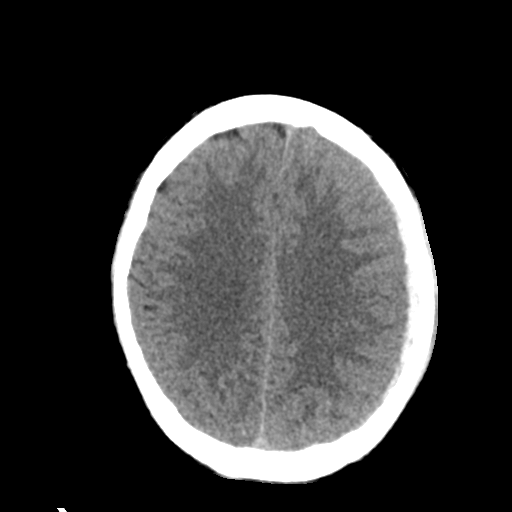
[im 21/34  bone]
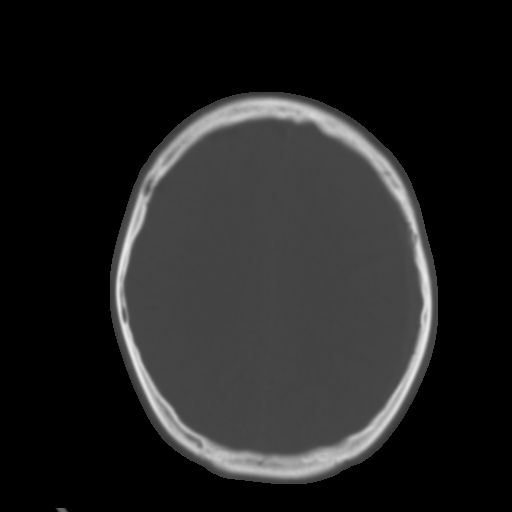
[im 25/34  brain]
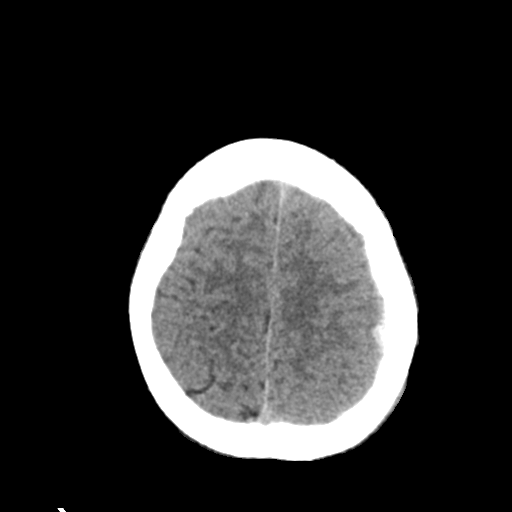
[im 29/34  brain]
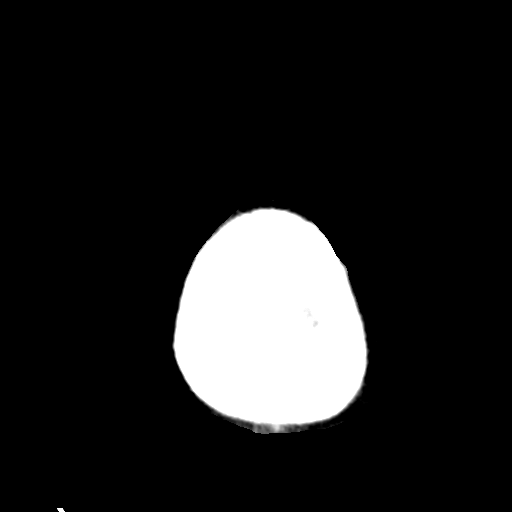

[Series 4: head bone · axial · 0.41mm/px · z∈[-143,-109]mm · 3 of 85 slices shown]
[im 9/85  bone]
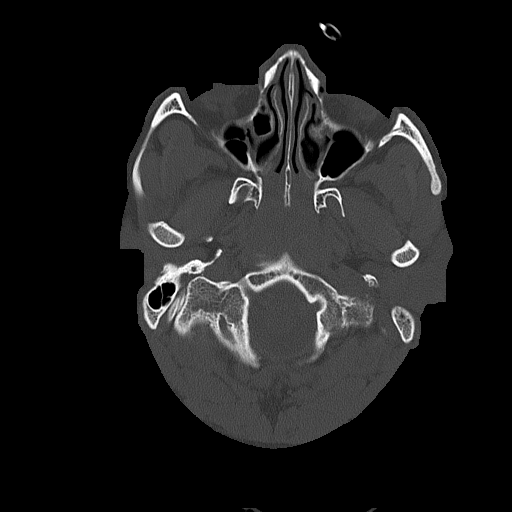
[im 17/85  bone]
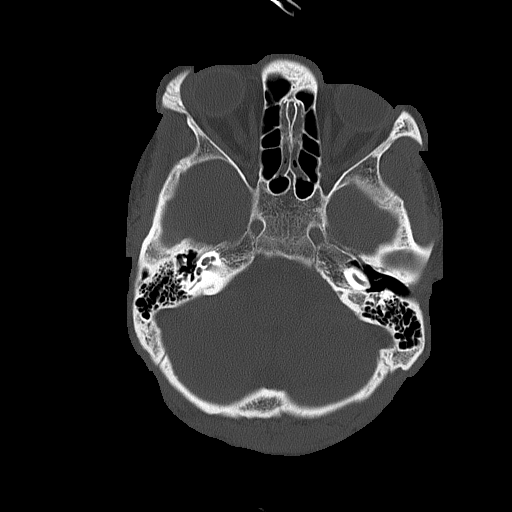
[im 26/85  bone]
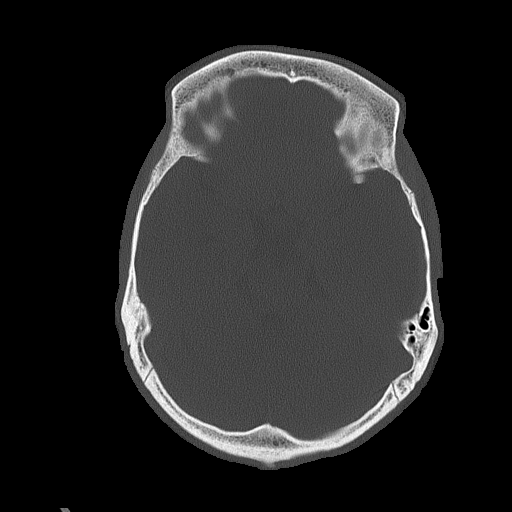

[Series 5: head without cor · coronal · non-contrast · 0.32mm/px · 3 of 67 slices shown]
[im 23/67  brain]
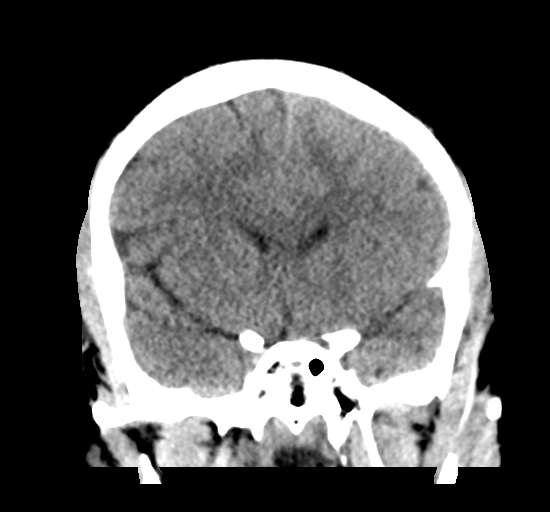
[im 30/67  brain]
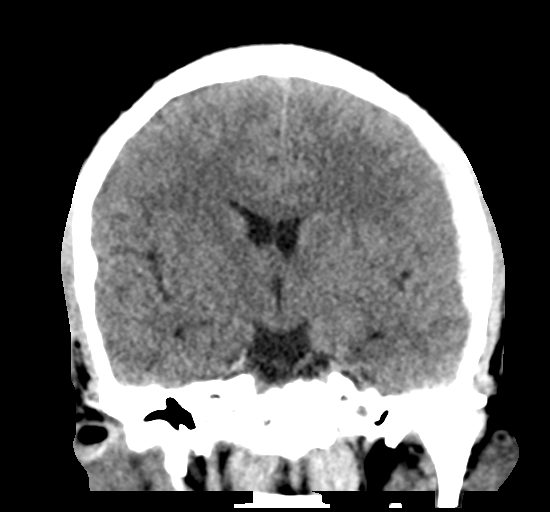
[im 37/67  brain]
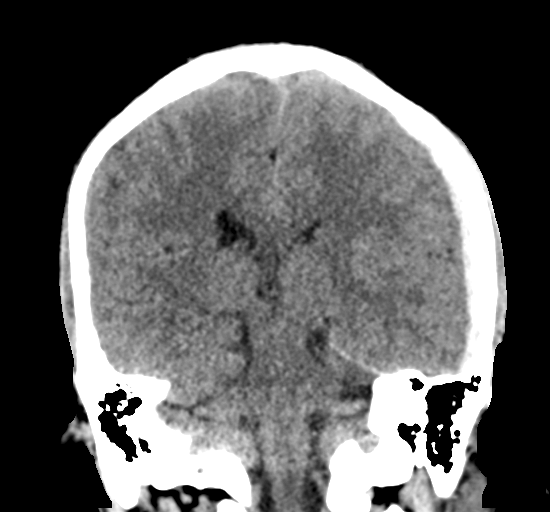

[Series 6: head without sag · sagittal · non-contrast · 0.31mm/px · 3 of 64 slices shown]
[im 22/64  brain]
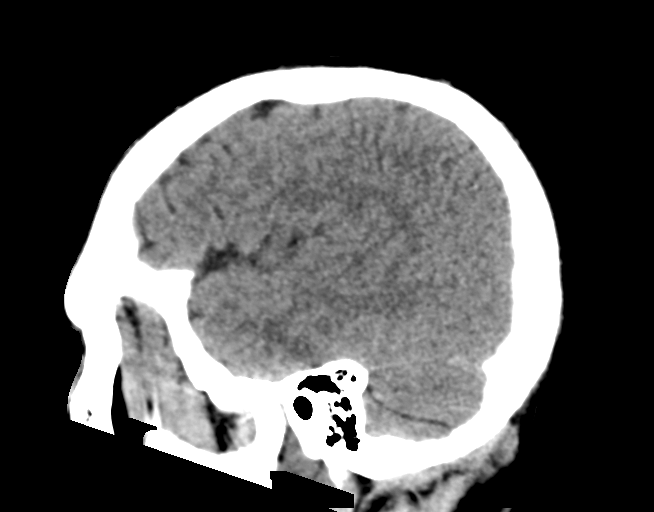
[im 32/64  brain]
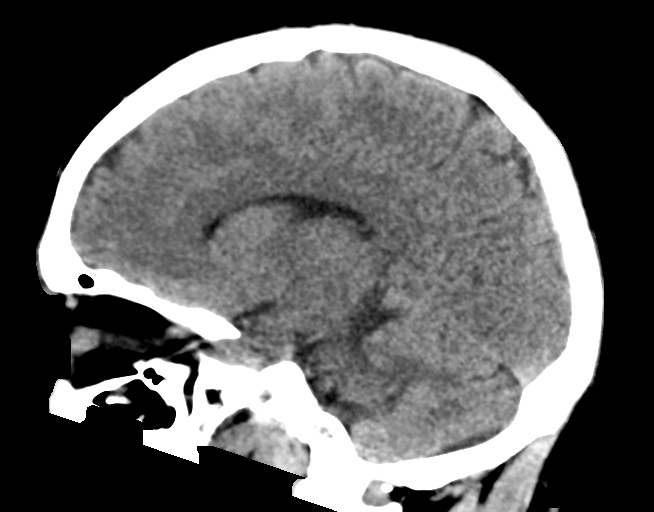
[im 43/64  brain]
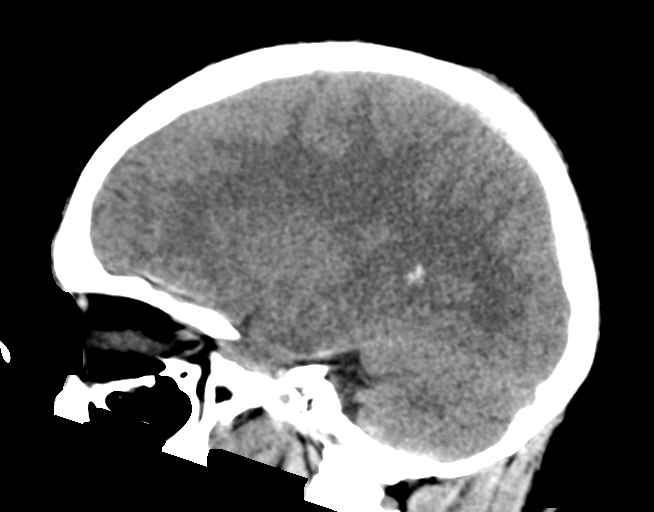

[16 of 47 positions shown; findings below may reference images not displayed]

FINDINGS: CT HEAD FINDINGS

Brain: Small left cerebral convexity acute subdural hematoma
measuring up to 4-5 mm in maximal thickness. Minimal mass effect on
the adjacent sulci with 3 mm left-to-right midline shift. No
herniation. No evidence of acute infarction, hydrocephalus, or mass
lesion.

Vascular: No hyperdense vessel or unexpected calcification.

Skull: Normal. Negative for fracture or focal lesion.

Sinuses/Orbits: No acute finding.

Other: None.

CT CERVICAL SPINE FINDINGS

Alignment: Straightening of the normal cervical lordosis. No
traumatic malalignment.

Skull base and vertebrae: No acute fracture. No primary bone lesion
or focal pathologic process.

Soft tissues and spinal canal: No prevertebral fluid or swelling. No
visible canal hematoma.

Disc levels:  Normal.

Upper chest: Please see separate CT chest report from same day.

Other: None.
IMPRESSION: 1. Small left cerebral convexity acute subdural hematoma measuring
up to 4-5 mm in maximal thickness. 3 mm left-to-right midline shift.
2.  No acute cervical spine fracture.

Critical Value/emergent results were discussed in person at the time
of interpretation on 03/08/2019 at [DATE] with provider ORTENS
HEVIA, who verbally acknowledged these results.

## 2020-04-26 IMAGING — CT CT CERVICAL SPINE W/O CM
3 of 4 series · 9 of 33 positions shown, 11 images · non-contrast
Comparison: None.

CLINICAL DATA: Fall.  Found down.

EXAM:
CT HEAD WITHOUT CONTRAST
CT CERVICAL SPINE WITHOUT CONTRAST
TECHNIQUE: Multidetector CT imaging of the head and cervical spine was
performed following the standard protocol without intravenous
contrast. Multiplanar CT image reconstructions of the cervical spine
were also generated.

[Series 4: c_spine 2.0 st · axial · 0.35mm/px · z∈[-237,-237]mm · 1 of 108 slices shown, 2 images]
[im 54/108  soft-tissue]
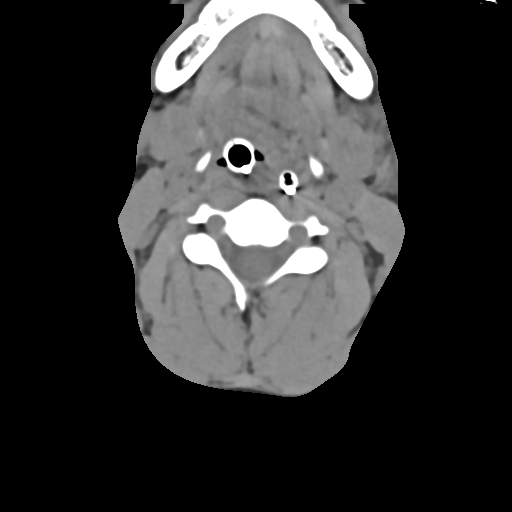
[im 54/108  bone]
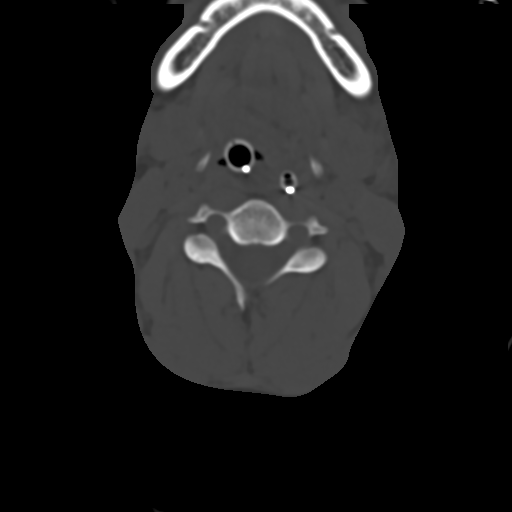

[Series 6: c_spine 2.0 sag bone · sagittal · 0.21mm/px · 5 of 61 slices shown, 6 images]
[im 21/61  bone]
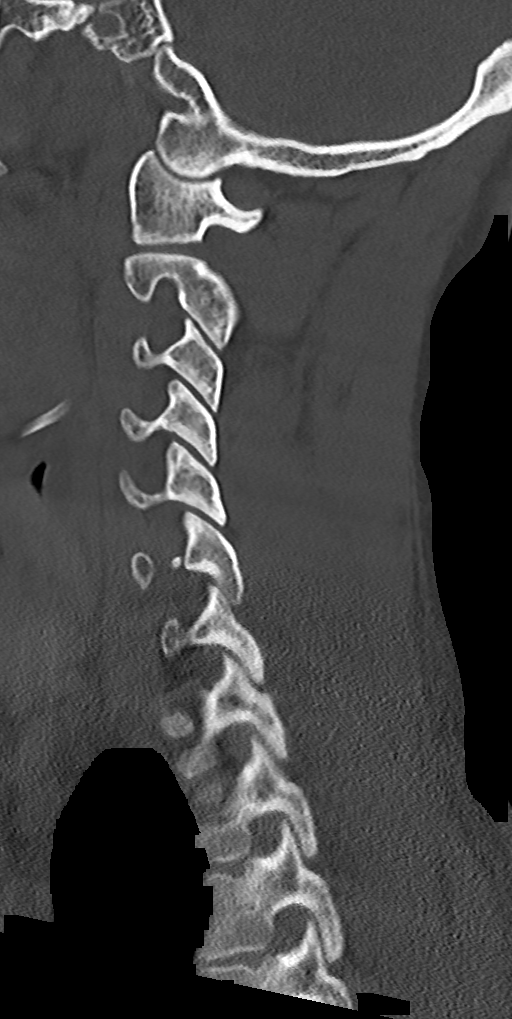
[im 26/61  bone]
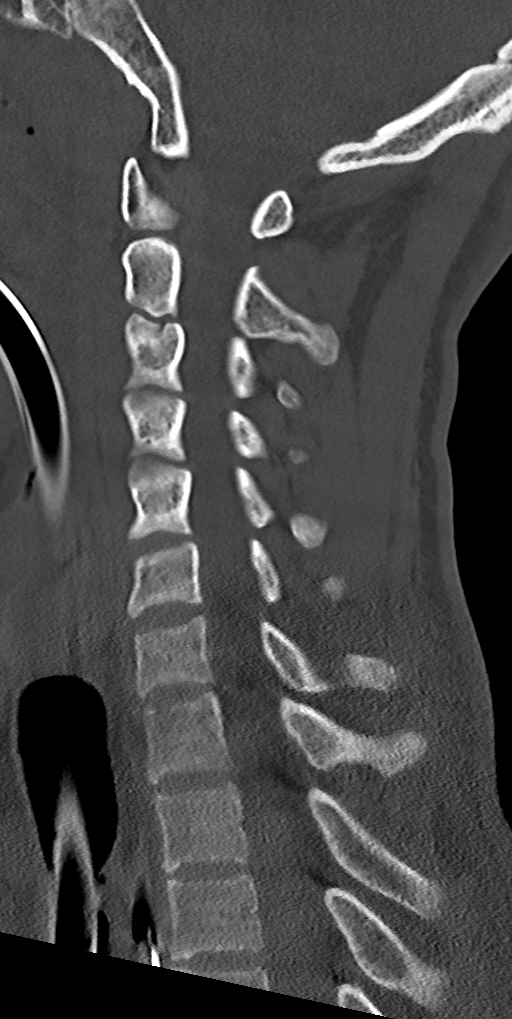
[im 31/61  soft-tissue]
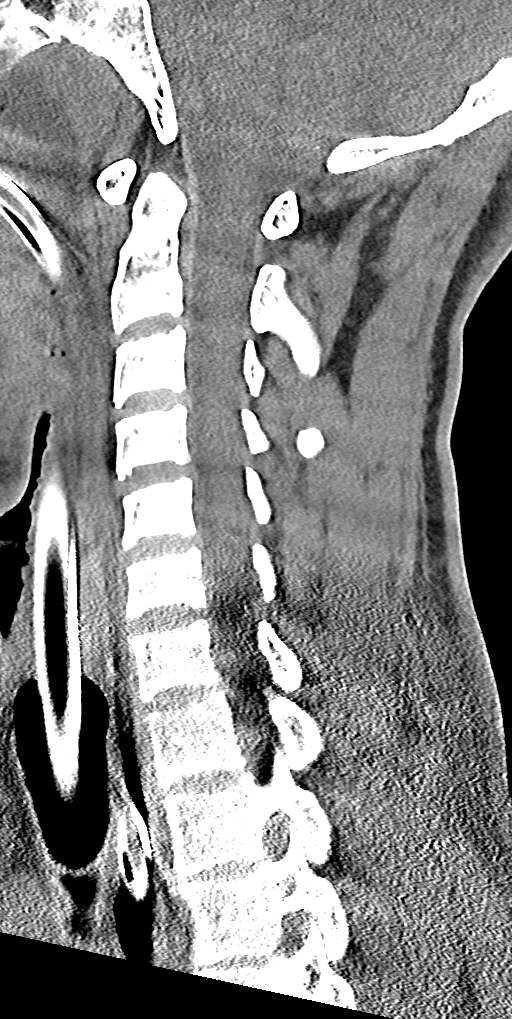
[im 31/61  bone]
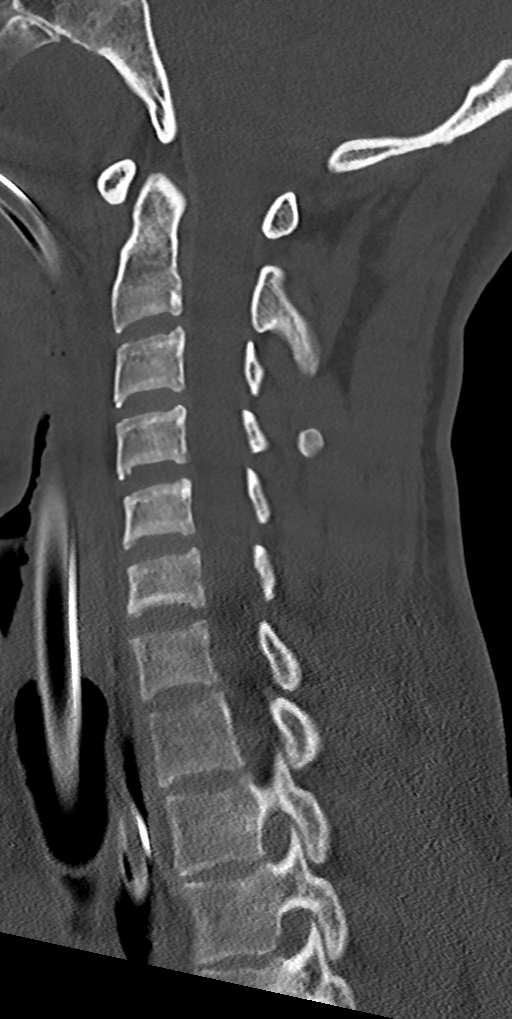
[im 36/61  bone]
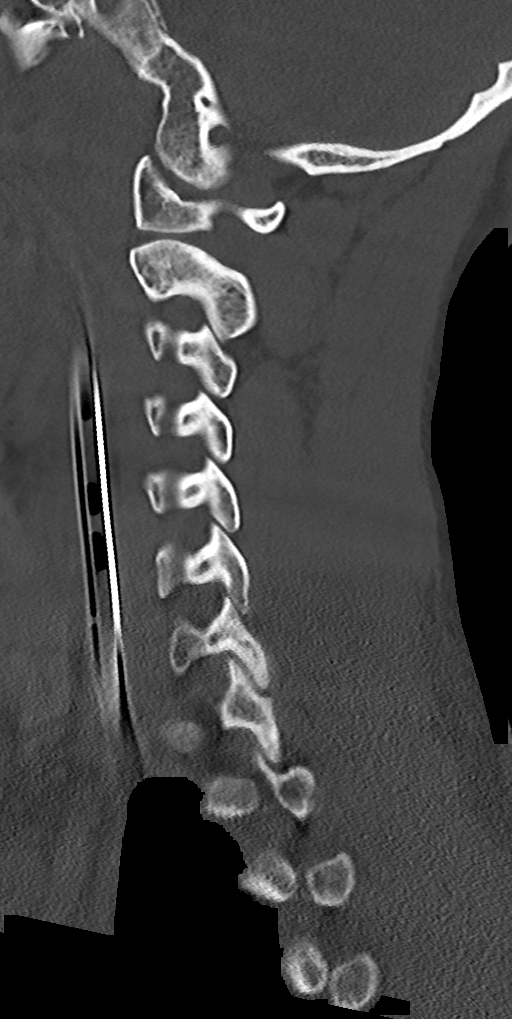
[im 41/61  bone]
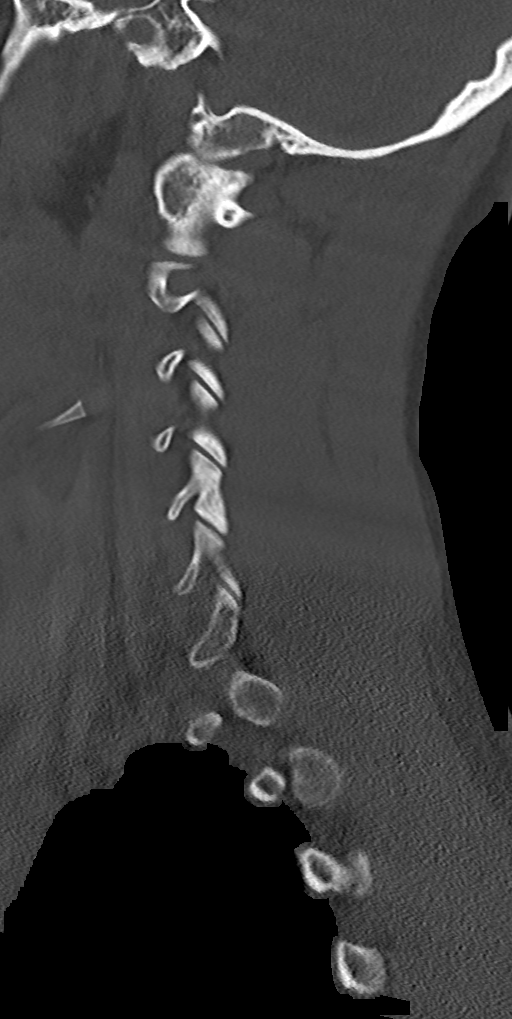

[Series 7: c_spine 2.0 cor bone · coronal · 0.23mm/px · 3 of 61 slices shown]
[im 13/61  bone]
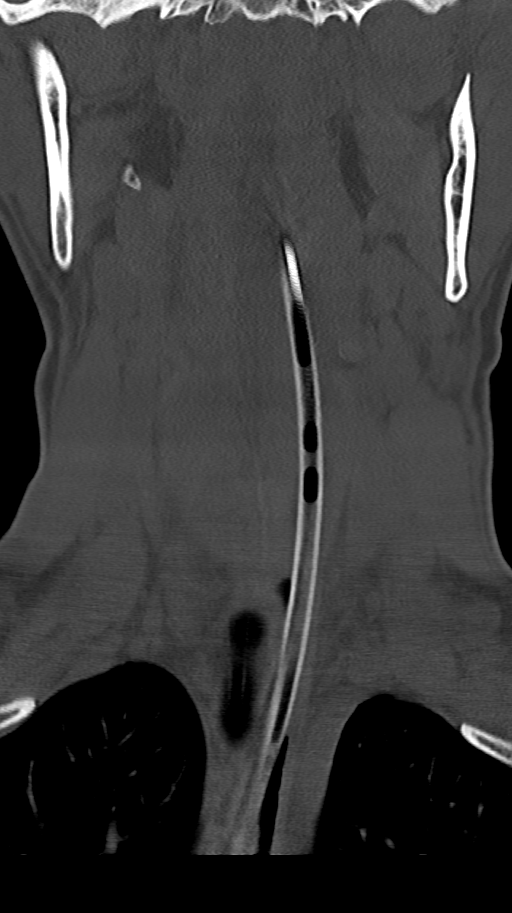
[im 25/61  bone]
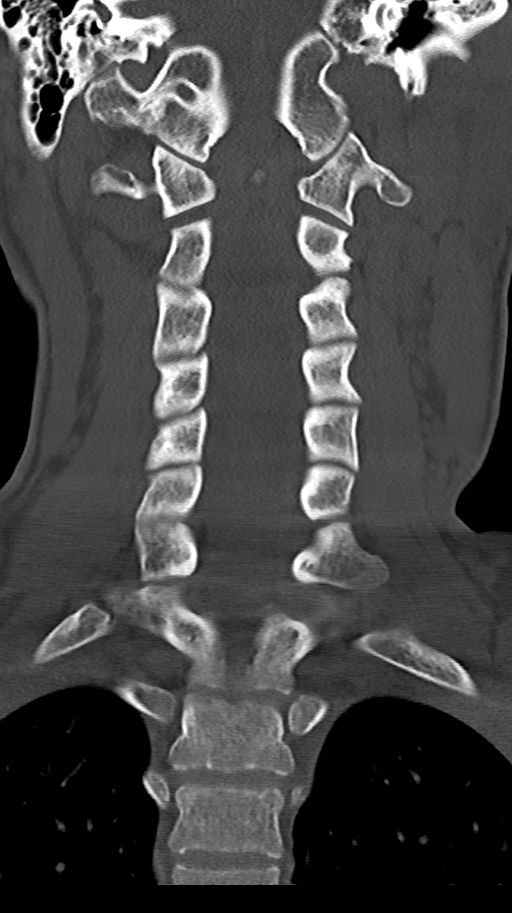
[im 37/61  bone]
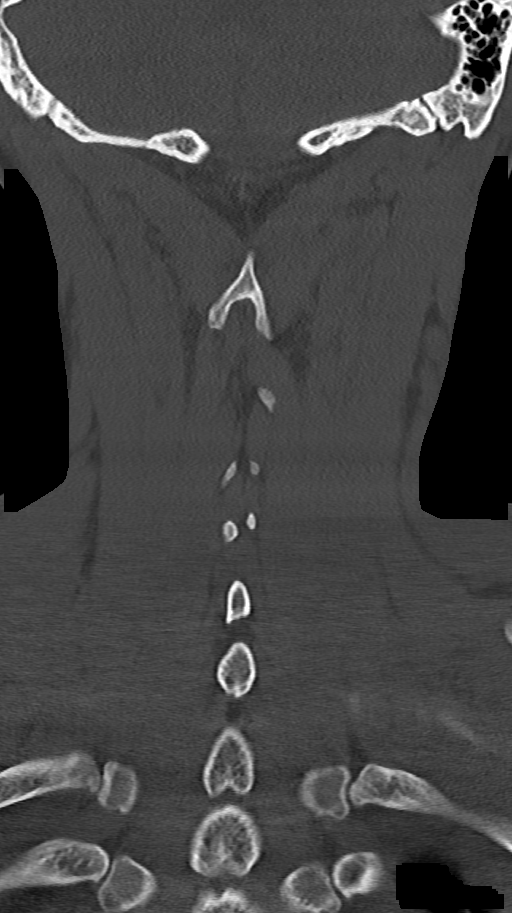

[9 of 33 positions shown; findings below may reference images not displayed]

FINDINGS: CT HEAD FINDINGS

Brain: Small left cerebral convexity acute subdural hematoma
measuring up to 4-5 mm in maximal thickness. Minimal mass effect on
the adjacent sulci with 3 mm left-to-right midline shift. No
herniation. No evidence of acute infarction, hydrocephalus, or mass
lesion.

Vascular: No hyperdense vessel or unexpected calcification.

Skull: Normal. Negative for fracture or focal lesion.

Sinuses/Orbits: No acute finding.

Other: None.

CT CERVICAL SPINE FINDINGS

Alignment: Straightening of the normal cervical lordosis. No
traumatic malalignment.

Skull base and vertebrae: No acute fracture. No primary bone lesion
or focal pathologic process.

Soft tissues and spinal canal: No prevertebral fluid or swelling. No
visible canal hematoma.

Disc levels:  Normal.

Upper chest: Please see separate CT chest report from same day.

Other: None.
IMPRESSION: 1. Small left cerebral convexity acute subdural hematoma measuring
up to 4-5 mm in maximal thickness. 3 mm left-to-right midline shift.
2.  No acute cervical spine fracture.

Critical Value/emergent results were discussed in person at the time
of interpretation on 03/08/2019 at [DATE] with provider ORTENS
HEVIA, who verbally acknowledged these results.

## 2020-04-27 IMAGING — DX DG PELVIS 3+V JUDET
1 series · 3 of 3 positions shown · non-contrast
Comparison: CT, 03/08/2019

CLINICAL DATA: Encounter for pelvis fracture.

EXAM:
JUDET PELVIS - 3+ VIEW

[Series 1: pelvis · 0.14mm/px · 3 of 3 slices shown]
[im 1/3]
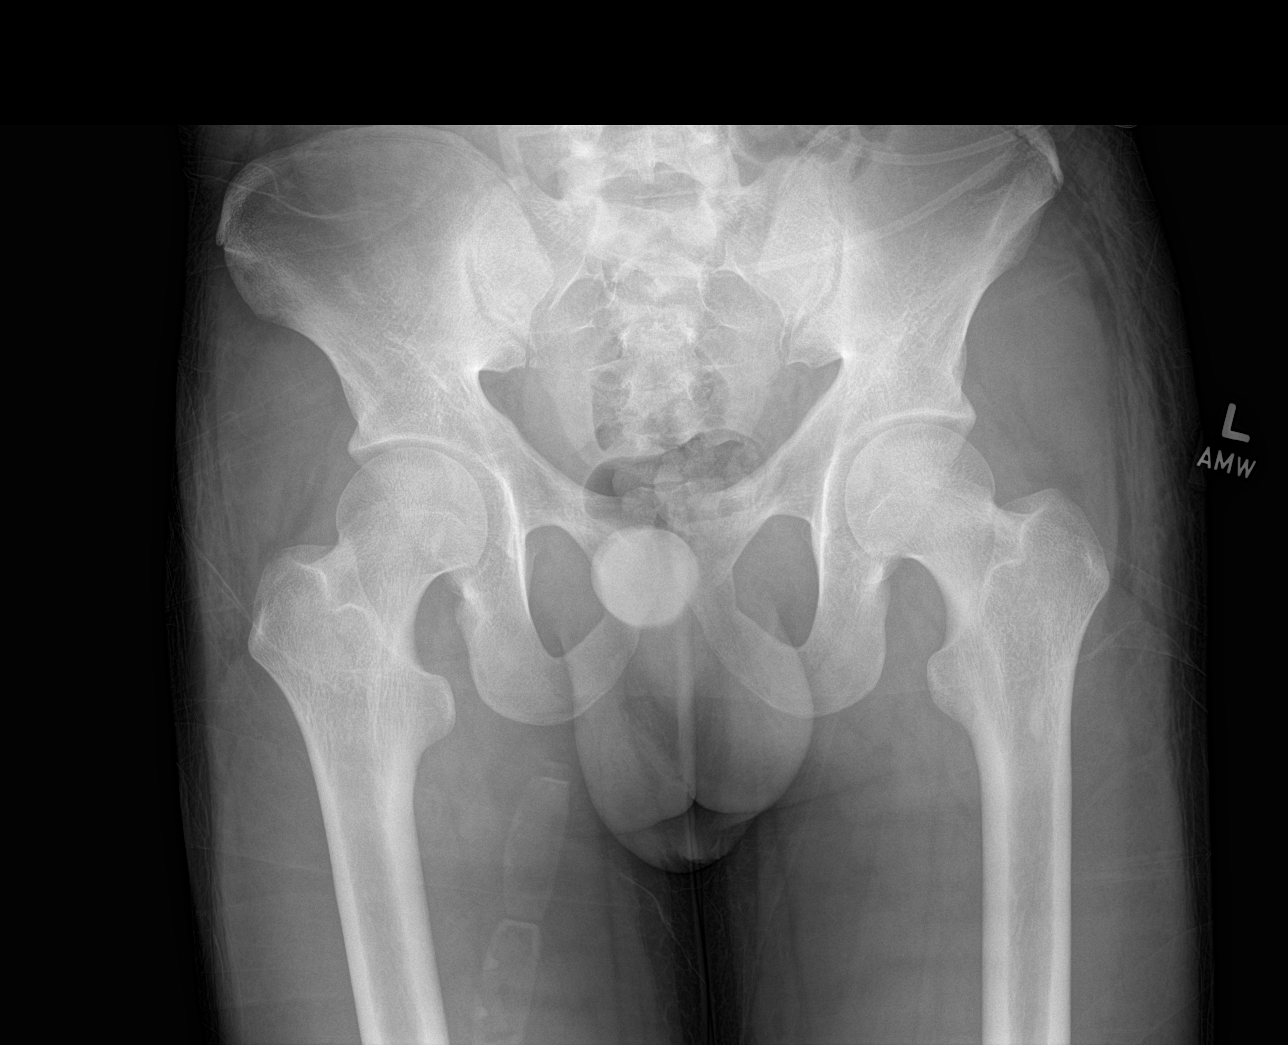
[im 2/3]
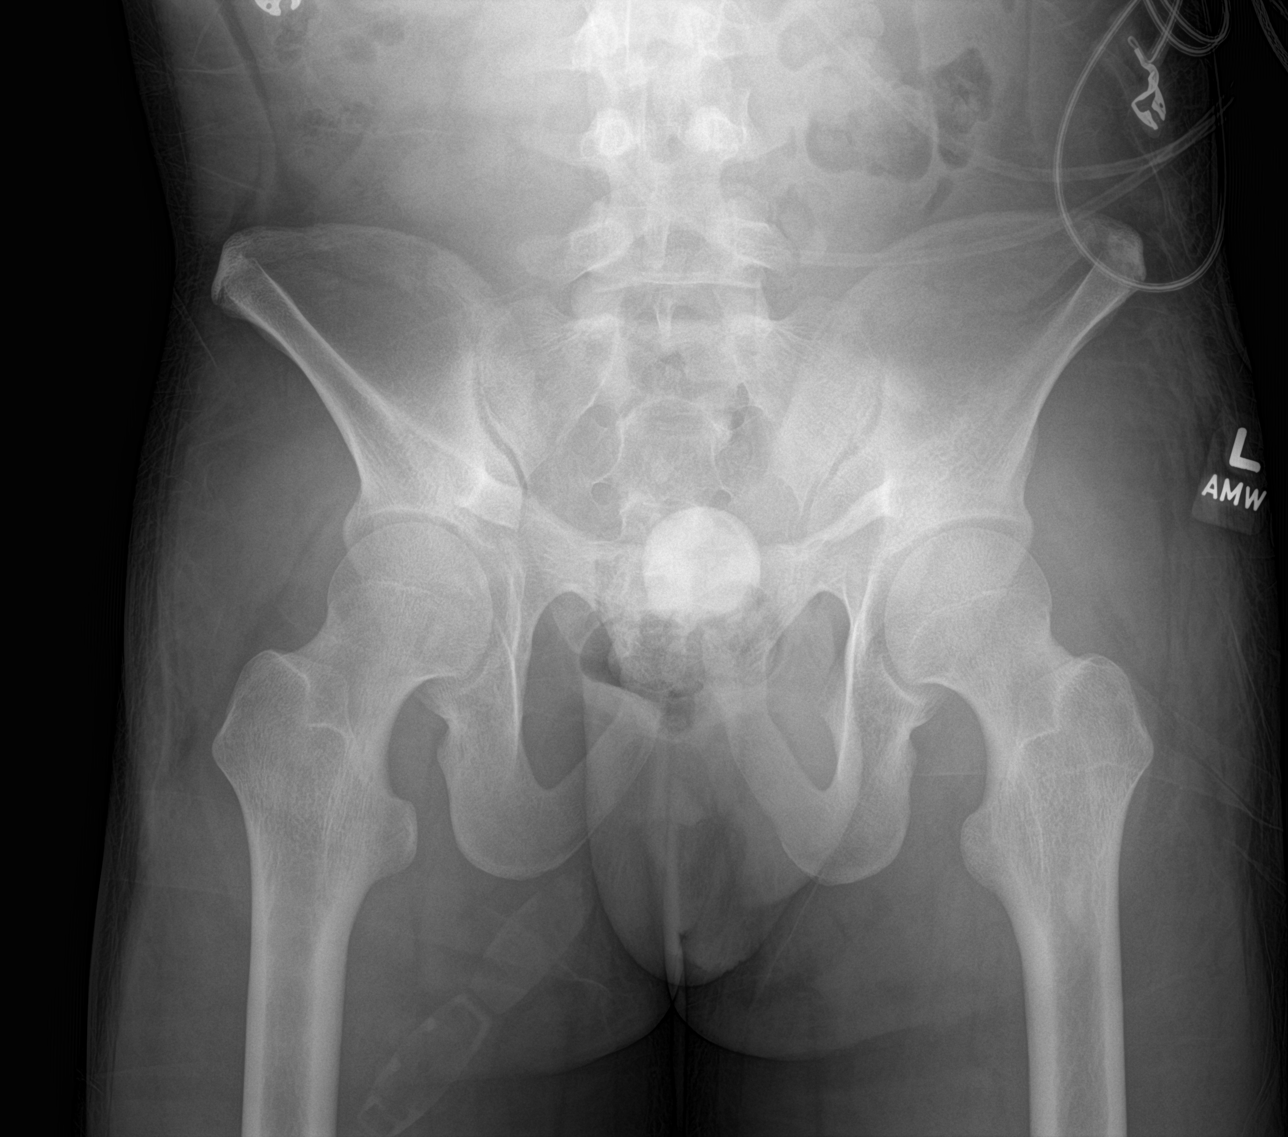
[im 3/3]
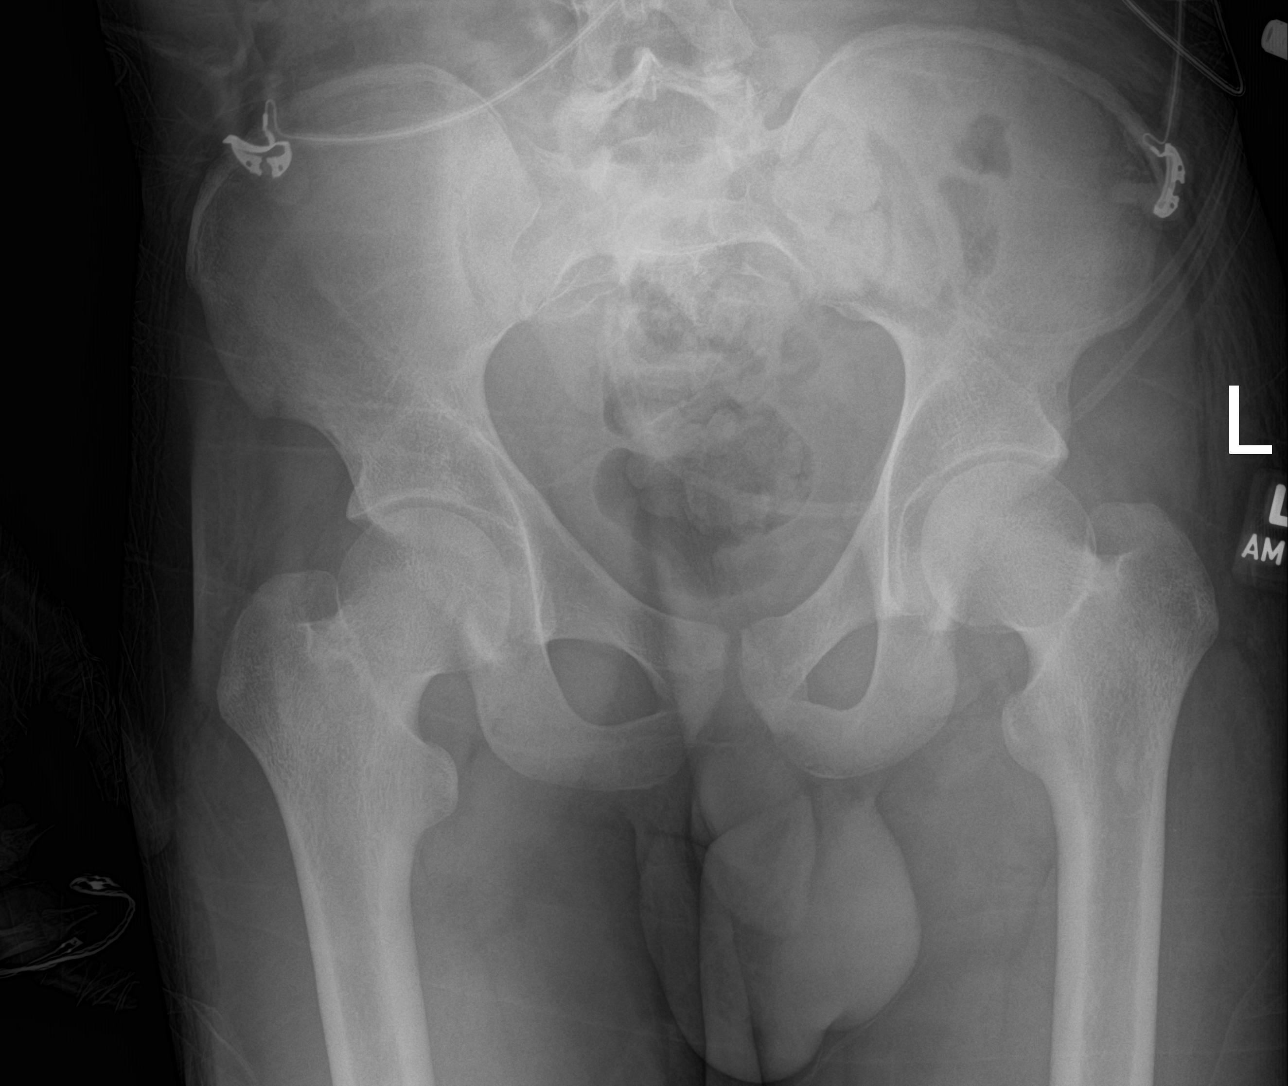

[3 of 3 positions shown; findings below may reference images not displayed]

FINDINGS: Subtle fracture line across the left iliac crest anteriorly,
corresponding to the fractures seen on the previous day's CT.

No other evidence of a fracture.  No bone lesion.

The hip joints, SI joints and symphysis pubis are normally spaced
and aligned.
IMPRESSION: 1. Nondisplaced fracture of the anterior left iliac crest, not
well-defined on radiographs. No other evidence of a fracture.
Normally aligned joints. No SI joint diastasis.
